# Patient Record
Sex: Female | Born: 1940
Health system: Southern US, Community
[De-identification: ages and names within clinical notes are randomized; demographics above are authoritative.]

## PROBLEM LIST (undated history)

## (undated) DIAGNOSIS — E079 Disorder of thyroid, unspecified: Secondary | ICD-10-CM

## (undated) DIAGNOSIS — R000 Tachycardia, unspecified: Secondary | ICD-10-CM

## (undated) DIAGNOSIS — K219 Gastro-esophageal reflux disease without esophagitis: Secondary | ICD-10-CM

## (undated) DIAGNOSIS — I10 Essential (primary) hypertension: Secondary | ICD-10-CM

## (undated) DIAGNOSIS — E7800 Pure hypercholesterolemia, unspecified: Secondary | ICD-10-CM

## (undated) HISTORY — DX: Disorder of thyroid, unspecified: E07.9

---

## 2009-05-25 ENCOUNTER — Emergency Department (HOSPITAL_COMMUNITY): Admission: EM | Admit: 2009-05-25 | Discharge: 2009-05-25 | Payer: Self-pay | Admitting: Family Medicine

## 2010-11-04 ENCOUNTER — Emergency Department (HOSPITAL_BASED_OUTPATIENT_CLINIC_OR_DEPARTMENT_OTHER)
Admission: EM | Admit: 2010-11-04 | Discharge: 2010-11-05 | Disposition: A | Discharge status: Nursing Home (Includes ICF) | Payer: Self-pay | Source: Home / Self Care | Attending: Emergency Medicine | Admitting: Emergency Medicine

## 2010-11-04 DIAGNOSIS — R000 Tachycardia, unspecified: Secondary | ICD-10-CM | POA: Insufficient documentation

## 2010-11-04 DIAGNOSIS — I10 Essential (primary) hypertension: Secondary | ICD-10-CM | POA: Insufficient documentation

## 2010-11-04 DIAGNOSIS — R0602 Shortness of breath: Secondary | ICD-10-CM | POA: Insufficient documentation

## 2010-11-05 ENCOUNTER — Inpatient Hospital Stay (HOSPITAL_COMMUNITY)
Admission: AD | Admit: 2010-11-05 | Discharge: 2010-11-07 | DRG: 645 | Disposition: A | Discharge status: Home / Self Care | Payer: Self-pay | Source: Other Acute Inpatient Hospital | Attending: Internal Medicine | Admitting: Internal Medicine

## 2010-11-05 ENCOUNTER — Inpatient Hospital Stay (HOSPITAL_COMMUNITY): Payer: Self-pay

## 2010-11-05 ENCOUNTER — Emergency Department (INDEPENDENT_AMBULATORY_CARE_PROVIDER_SITE_OTHER): Payer: Self-pay

## 2010-11-05 DIAGNOSIS — I498 Other specified cardiac arrhythmias: Secondary | ICD-10-CM | POA: Diagnosis present

## 2010-11-05 DIAGNOSIS — R1310 Dysphagia, unspecified: Secondary | ICD-10-CM | POA: Diagnosis present

## 2010-11-05 DIAGNOSIS — M545 Low back pain, unspecified: Secondary | ICD-10-CM | POA: Diagnosis present

## 2010-11-05 DIAGNOSIS — R0602 Shortness of breath: Secondary | ICD-10-CM

## 2010-11-05 DIAGNOSIS — R000 Tachycardia, unspecified: Secondary | ICD-10-CM

## 2010-11-05 DIAGNOSIS — I10 Essential (primary) hypertension: Secondary | ICD-10-CM | POA: Diagnosis present

## 2010-11-05 DIAGNOSIS — E0500 Thyrotoxicosis with diffuse goiter without thyrotoxic crisis or storm: Principal | ICD-10-CM | POA: Diagnosis present

## 2010-11-05 DIAGNOSIS — J45909 Unspecified asthma, uncomplicated: Secondary | ICD-10-CM | POA: Diagnosis present

## 2010-11-05 DIAGNOSIS — Z7982 Long term (current) use of aspirin: Secondary | ICD-10-CM

## 2010-11-05 LAB — GLUCOSE, CAPILLARY
Glucose-Capillary: 97 mg/dL (ref 70–99)
Glucose-Capillary: 95 mg/dL (ref 70–99)
Glucose-Capillary: 86 mg/dL (ref 70–99)

## 2010-11-05 LAB — URINE MICROSCOPIC-ADD ON

## 2010-11-05 LAB — DIFFERENTIAL
Basophils Absolute: 0 K/uL (ref 0.0–0.1)
Basophils Relative: 0 % (ref 0–1)
Eosinophils Absolute: 0.3 K/uL (ref 0.0–0.7)
Eosinophils Relative: 4 % (ref 0–5)
Lymphocytes Relative: 37 % (ref 12–46)
Lymphs Abs: 2.6 K/uL (ref 0.7–4.0)
Monocytes Absolute: 0.9 K/uL (ref 0.1–1.0)
Monocytes Relative: 12 % (ref 3–12)
Neutro Abs: 3.4 K/uL (ref 1.7–7.7)
Neutrophils Relative %: 47 % (ref 43–77)

## 2010-11-05 LAB — POCT CARDIAC MARKERS
CKMB, poc: <1 ng/mL — ABNORMAL LOW (ref 1.0–8.0)
Myoglobin, poc: 51.2 ng/mL (ref 12–200)
Troponin i, poc: <0.05 ng/mL (ref 0.00–0.09)

## 2010-11-05 LAB — COMPREHENSIVE METABOLIC PANEL WITH GFR
ALT: 17 U/L (ref 0–35)
AST: 24 U/L (ref 0–37)
Albumin: 4.2 g/dL (ref 3.5–5.2)
Alkaline Phosphatase: 149 U/L — ABNORMAL HIGH (ref 39–117)
BUN: 14 mg/dL (ref 6–23)
CO2: 25 meq/L (ref 19–32)
Calcium: 9.9 mg/dL (ref 8.4–10.5)
Chloride: 107 meq/L (ref 96–112)
Creatinine, Ser: 0.5 mg/dL (ref 0.4–1.2)
GFR calc non Af Amer: >60 mL/min
Glucose, Bld: 150 mg/dL — ABNORMAL HIGH (ref 70–99)
Potassium: 3.9 meq/L (ref 3.5–5.1)
Sodium: 145 meq/L (ref 135–145)
Total Bilirubin: 1.4 mg/dL — ABNORMAL HIGH (ref 0.3–1.2)
Total Protein: 7.5 g/dL (ref 6.0–8.3)

## 2010-11-05 LAB — URINALYSIS, ROUTINE W REFLEX MICROSCOPIC
Ketones, ur: NEGATIVE mg/dL
Leukocytes, UA: NEGATIVE
Nitrite: NEGATIVE
Protein, ur: NEGATIVE mg/dL

## 2010-11-05 LAB — TROPONIN I: Troponin I: 0.02 ng/mL (ref 0.00–0.06)

## 2010-11-05 LAB — CBC
MCH: 26.4 pg (ref 26.0–34.0)
MCV: 78.9 fL (ref 78.0–100.0)
Platelets: 315 10*3/uL (ref 150–400)
RDW: 12.4 % (ref 11.5–15.5)
WBC: 7.1 10*3/uL (ref 4.0–10.5)

## 2010-11-05 LAB — CK TOTAL AND CKMB (NOT AT ARMC)
CK, MB: 1.2 ng/mL (ref 0.3–4.0)
CK, MB: 1 ng/mL (ref 0.3–4.0)
Relative Index: INVALID (ref 0.0–2.5)
Relative Index: INVALID (ref 0.0–2.5)
Total CK: 52 U/L (ref 7–177)
Total CK: 47 U/L (ref 7–177)
Total CK: 41 U/L (ref 7–177)

## 2010-11-05 LAB — TSH: TSH: 0.01 u[IU]/mL — ABNORMAL LOW (ref 0.350–4.500)

## 2010-11-05 LAB — T3, FREE: T3, Free: 10 pg/mL — ABNORMAL HIGH (ref 2.3–4.2)

## 2010-11-05 LAB — POCT B-TYPE NATRIURETIC PEPTIDE (BNP): B Natriuretic Peptide, POC: 20 pg/mL (ref 0–100)

## 2010-11-05 MED ORDER — IOHEXOL 350 MG/ML SOLN
80.0000 mL | Freq: Once | INTRAVENOUS | Status: AC | PRN
  Administered 2010-11-05: 80 mL via INTRAVENOUS

## 2010-11-06 ENCOUNTER — Inpatient Hospital Stay (HOSPITAL_COMMUNITY): Payer: Self-pay

## 2010-11-06 LAB — GLUCOSE, CAPILLARY
Glucose-Capillary: 101 mg/dL — ABNORMAL HIGH (ref 70–99)
Glucose-Capillary: 115 mg/dL — ABNORMAL HIGH (ref 70–99)
Glucose-Capillary: 125 mg/dL — ABNORMAL HIGH (ref 70–99)
Glucose-Capillary: 144 mg/dL — ABNORMAL HIGH (ref 70–99)
Glucose-Capillary: 189 mg/dL — ABNORMAL HIGH (ref 70–99)

## 2010-11-06 LAB — CBC
Hemoglobin: 11.2 g/dL — ABNORMAL LOW (ref 12.0–15.0)
RBC: 4.41 MIL/uL (ref 3.87–5.11)

## 2010-11-06 LAB — BASIC METABOLIC PANEL
CO2: 26 mEq/L (ref 19–32)
Chloride: 106 mEq/L (ref 96–112)
GFR calc Af Amer: >60 mL/min (ref >60)
Sodium: 139 mEq/L (ref 135–145)

## 2010-11-06 NOTE — Consult Note (Signed)
NAMECARIDAD, Katelyn Bennett                 ACCOUNT NO.:  [id number]  MEDICAL RECORD NO.:  [id number]           PATIENT TYPE:  I  LOCATION:  3706                         FACILITY:  [facility]  PHYSICIAN:  Tonita Cong, M.D.  DATE OF BIRTH:  1941-03-23  DATE OF CONSULTATION:  11/05/2010 DATE OF DISCHARGE:                                CONSULTATION   REQUESTING PHYSICIAN:  Ramiro Harvest, MD of [facility].  REASON FOR CONSULTATION:  Hyperthyroidism.  CHIEF COMPLAINT:  Palpitations.  HISTORY OF PRESENT ILLNESS:  [name] has experienced palpitations that were intermittent for the past 2 weeks.  Then, on the day of admission, it was persistent with associated dyspnea.  She has also had recent tremors that are intermittent in her hands.  She describes muscle cramps in her forearms.  She has dysphagia with water, pills, and food that has been intermittent and relatively mild "for while" about 80-month duration "nothing new" according to her family.  She is accompanied by her daughter who translates into [place] on her behalf. No precipitating or contributing factors to their knowledge.  She has not taken any thyroid hormone by prescriptions or supplement.  To their knowledge, no previous thyroid dysfunction for the patient.  FAMILY MEDICAL HISTORY:  One daughter who has hyperthyroidism diagnosed at age 33, now managed with a medication.  Initially in remission but now requiring a medication again.  Another daughter had a goiter in her early teens.  It resolved with increased dietary iodine intake plus a  medication.  The patient has no known drug allergies.  MEDICATION LIST:  Reviewed.  PAST MEDICAL HISTORY:  Asthma, hypertension, chronic low back pain, and recent vitamin D deficiency.  SOCIAL HISTORY:  The patient lives in [place] but is currently visiting one of her daughters.  No tobacco use.  She plans to return to [place] this Wednesday.  REVIEW OF SYSTEMS:  No weight  loss or weight gain.  No nausea, vomiting, or abdominal pain.  No diarrhea, bulging eyes, or eye redness but she does have itching eyes.  No hoarseness, heat intolerance, or rash.  PHYSICAL EXAMINATION:  VITAL SIGNS:  Temperature 97.5, pulse 117 but regular, respirations 19, blood pressure 130/78, and oxygen saturation 96% on room air. GENERAL:  Lying quietly and comfortably in bed. HEAD, EARS, NOSE, AND THROAT:  Unremarkable.  Eyes: no proptosis, stare, lid lag, or globe lag.  Extraocular muscles seem intact. NECK:  Mild symmetric enlargement of thyroid without audible bruit or discrete nodule.  The thyroid gland is a bit firm without a discrete area of induration.  Thyroid gland is mobile and nontender.  No cervical lymphadenopathy. CARDIOVASCULAR:  Regular but rapid.  Pulses full. SKIN:  Mildly excessive warmth.  No visible rash or jaundice. RESPIRATORY:  Clear, symmetric, unlabored. NEUROLOGIC:  No tremor.  No focal deficits.  LABORATORY DATA:  TSH 0.01 microunits per milliliter which is low.  Free T4 is 3.54 ng/dL with reference range 6.0-4.5.  free T3 of 10 picogram per mL with reference range 2.3-4.2.  Sodium 145, potassium 3.9, BUN 14, creatinine 0.5, and calcium 9.9.  Albumin 4.2,  ALT 17, AST 24, alkaline phosphatase mildly elevated at 149, and bilirubin 1.4 which is mildly elevated.  Neutrophils 3.4, platelets 315, MCV 78.9, hemoglobin 12.4, and white blood count 7.1.  She had a CT scan of chest with contrast including 80 mL Omnipaque 350 .  The thyroid appeared upper limit of normal in size.  No pulmonary embolism.  She received Pneumovax and Fluvirin immunizations today.  Now receiving Metoprolol 25 mg by mouth three times a day.  ASSESSMENT AND PLAN:  Hyperthyroidism.    Presumably Graves disease based upon her family history including a daughter with hyperthyroidism managed with medication at a relatively young age.  No discrete nodules by exam but a thyroid  ultrasound has been planned.  Other possibilities include a thyroiditis.  If a thyroiditis, then thyrotoxicosis should resolve within 3 months even without intervention.  Toxic nodules or Graves' disease would benefit from methimazole or another antithyroid medication.  At this time, her levels are too high and her symptoms too great to proceed directly with thyroid surgery.  Since she had iodinated  contrast that will postpone nuclear medicine I-131 therapy or even a diagnostic thyroid uptake scan for at least 6 weeks.  With the iodine exposure, this could transiently worsen her hyperthyroidism.  I explained the risks  to the daughter and the patient.    We will start methimazole 60 mg by mouth once a day now.  We discussed the anticipated benefits, alternatives, nature of therapy, and risks including but not limited to rash, allergic reactions, liver damage, bone marrow suppression, etc.  I instructed the patient and her daughter to discontinue methimazole and contact me or her primary care physician if she develops rash, jaundice, fever, sore throat, or other concerning symptoms while taking methimazole.  At this time, we will check thyroid antibody studies including thyroid peroxidase antibody, thyroglobulin antibody, and thyroid stimulating immunoglobulin.  Continue beta-blocker therapy and proceed with thyroid ultrasound as planned by the hospitalist team, specifically Dr. Janee Morn.  Generally, I would obtain a CBC with differential and hepatic panel about 1 week after starting the methimazole and then thyroid function tests including TSH and free T4 about 3 weeks after starting methimazole.  However, [name] plans to leave for [place] this Wednesday.  If she is still in the hospital, I  recommend a CBC with differential and hepatic panel before her hospital discharge.  The daughter agreed to contact her physician in [place] to arrange a visit as soon as possible after her return  to her home [place].     Tonita Cong, M.D.     JSK/MEDQ  D:  11/05/2010  T:  11/06/2010  Job:  [id number]  Electronically Signed by Talmage Coin M.D. on 11/06/2010 04:38:21 PM

## 2010-11-07 LAB — COMPREHENSIVE METABOLIC PANEL
Albumin: 3.4 g/dL — ABNORMAL LOW (ref 3.5–5.2)
BUN: 14 mg/dL (ref 6–23)
Chloride: 104 mEq/L (ref 96–112)
Creatinine, Ser: 0.43 mg/dL (ref 0.4–1.2)
Total Bilirubin: 1.2 mg/dL (ref 0.3–1.2)

## 2010-11-07 LAB — DIFFERENTIAL
Basophils Relative: 0 % (ref 0–1)
Eosinophils Absolute: 0.3 10*3/uL (ref 0.0–0.7)
Monocytes Absolute: 0.8 10*3/uL (ref 0.1–1.0)
Monocytes Relative: 12 % (ref 3–12)

## 2010-11-07 LAB — CBC
Hemoglobin: 12.6 g/dL (ref 12.0–15.0)
MCH: 25.8 pg — ABNORMAL LOW (ref 26.0–34.0)
MCHC: 32.4 g/dL (ref 30.0–36.0)
Platelets: 308 10*3/uL (ref 150–400)

## 2010-11-07 LAB — GLUCOSE, CAPILLARY
Glucose-Capillary: 105 mg/dL — ABNORMAL HIGH (ref 70–99)
Glucose-Capillary: 116 mg/dL — ABNORMAL HIGH (ref 70–99)

## 2010-11-07 LAB — THYROID ANTIBODIES
Thyroglobulin Ab: 46 U/mL — ABNORMAL HIGH (ref <40.0)
Thyroperoxidase Ab SerPl-aCnc: 1327 IU/mL — ABNORMAL HIGH (ref <35.0)

## 2010-11-11 NOTE — Discharge Summary (Signed)
Katelyn Bennett, Katelyn Bennett                 ACCOUNT NO.:  [id number]  MEDICAL RECORD NO.:  [id number]           PATIENT TYPE:  I  LOCATION:  3706                         FACILITY:  [facility]  PHYSICIAN:  Ramiro Harvest, MD    DATE OF BIRTH:  [birth year]  DATE OF ADMISSION:  11/05/2010 DATE OF DISCHARGE:  11/07/2010                        DISCHARGE SUMMARY   PRIMARY CARE PHYSICIAN:  The patient's primary care physician is in [place].  ENDOCRINOLOGIST:  Dr. Tonita Cong, MD  DISCHARGE DIAGNOSES: 1. Hyperthyroidism presumably Graves disease versus thyroiditis versus     autoimmune thyroid disease. 2. Palpitations secondary to hyperthyroidism. 3. Sinus tachycardia secondary to hyperthyroidism. 4. Hypertension. 5. History of asthma. 6. Chronic low back pain. 7. Recent vitamin D deficiency. 8. Dysphasia likely secondary to hyperthyroidism.  DISCHARGE MEDICATIONS: 1. Methimazole 60 mg p.o. daily. 2. Metoprolol 75 mg p.o. b.i.d. 3. Advair Diskus 100/50 1 puff twice daily. 4. Aleve 220 mg p.o. b.i.d. p.r.n. 5. Aspirin 81 mg p.o. daily. 6. Gabapentin 100 mg p.o. t.i.d.  DISPOSITION AND FOLLOWUP:  The patient will be discharged home.  The patient is to call Dr. Daune Perch office tomorrow to follow up on her hyperthyroidism and it would be decided for hospital follow up at that time as do an appointment if needed.  The patient is also to follow up with her PCP when she goes back home to [place] at which point in time with hyperthyroidism and will need to be followed up upon and her rate control as well as 2D echo results, which were done during this hospitalization will need to be followed up upon as these were pending at the time of this dictation.  CONSULTATIONS DONE:  Endocrinology consultation was done.  The patient was seen in consultation by Dr. Talmage Coin on November 05, 2010.  PROCEDURES PERFORMED:  Chest x-ray was done November 05, 2010 that showed questionable slight redistribution  of pulmonary blood flow.  No definite active process.  CT angiogram of the chest done November 05, 2010 showed no evidence of acute pulmonary embolism.  No acute abnormality, bilobular low attenuation instruction in the left upper quadrant with calcification most consistent with an old process, possibly involving the superior aspect of the left adrenal gland.  CT of the head without contrast done on November 05, 2010 negative CT of the head, esophagram which was done November 06, 2010 showed mild to moderate press the esophagus.  No evidence of esophageal stricture or other significant abnormality.  Thyroid ultrasound done on November 06, 2010 showed mild goiter with diffuse heterogeneous echogenicity and single less than 1 cm discrete left lobe nodule.  A 2D echocardiogram was done on November 07, 2010 and results are pending at the time of this dictation.  BRIEF ADMISSION HISTORY AND PHYSICAL:  Katelyn Bennett is a 70 year old [place] female who was visiting the family from [place] with a history of asthma, chronic low back pain, hypertension and been experiencing palpitations over the past week.  The patient was here from [place] visiting her daughter.  The patient comes and stays for a few months and usually goes back.  The patient  had been experiencing some cough and that she had Flovent, which she usually takes.  She had gone to a clinic in March, she was given Advair Diskus.  When she was there at that time, she was told that the heart rate was slightly high.  At that time she did not have any palpitations, but subsequent to that she started having palpitations off and on.  Yesterday, her palpitations became more persistent.  She decided to present to the ED.  In the ED, the patient was found to have heart rates in the 130s.  No chest pain or shortness of breath.  Denied any nausea, no vomiting, no abdominal pain, no dysuria, no discharge, no diarrhea, no dizziness or loss of consciousness.  No  focal deficits.  The patient's daughter stated that the patient does have some choking sensation when she takes the Neurontin pill.  In the ED, the patient had CT angiogram of the chest done, which was negative.  The patient was subsequently admitted for further observation of palpitation with sinus tachycardia.  For the rest of admission history and physical, please see H and P dictated per Dr. Toniann Fail of job number [id number].  HOSPITAL COURSE: 1. Hyperthyroidism.  The patient was admitted with some palpitations     which was a sinus tachycardia on EKG.  She was placed on a     telemonitoring and a heart rate on admission was 120s to the 130s.     A cardiac enzymes q.8 h x3 were cycled which were negative x3.  A     TSH, which was obtained was decrease at 0.010, free T4 which was     obtained came back elevated at 3.51 and a free T3 which was     obtained was elevated at 10.0.  A thyroid ultrasound was     subsequently done on November 06, 2010, which showed a mild goiter     with diffuse heterogeneous echogenicity and single less than 1 cm     discrete left lobe nodule.  The patient was placed on oral     Lopressor for rate control.  She took 25 mg twice daily, which was     subsequently increased to 25 mg three times daily.  She was     maintained on home dose for lisinopril as well for her blood     pressure issues/hypertension.  A Endocrinology consultation was     done.  The patient was seen in consultation by Dr. Talmage Coin on     November 05, 2010 at which time it was felt that the patient     presumably may have Graves disease based on her family history with     a daughter that had hyperthyroidism versus thyroiditis versus     autoimmune thyroid disease.  A thyroid antibody studies were     ordered and the patient had received iodinated contrast that she     had a CT angiogram of the chest done which was negative for PE.     Due to iodinated contrast, a nuclear medicine I-131  study/thyroid     uptake scan could not be done for the next 6 weeks and due to the     patient's iodine exposure, it was felt that this could transiently     worsen her hyperthyroidism.  The patient was started on methimazole     60 mg daily orally and she was monitored.  A hepatic panel was  obtained on day of discharge which was within normal limits.  The     patient remained stable.  She remained afebrile, did not have any     jaundice.  No fever, no sore throat, no other concerning symptoms     while she has been on the methimazole.  She was maintained on a     beta-blocker.  Beta-blocker therapy was increased on the day of     discharge to 75 mg twice daily for better rate control.  The     patient had planned to leave for [place] this Wednesday October 11, 2010.  The patient improved clinically during the     hospitalization.  She will be discharged home on Lopressor 75 mg     twice daily.  The patient is to call Dr. Daune Perch office tomorrow to     speak with him to decide on follow up for her hyperthyroidism.  Lab     studies are pending at the time of this dictation as well as her 2-     D echo.  The patient will be discharged in stable and improved     condition. 2. Palpitations secondary to hyperthyroidism. 3. Sinus tachycardia secondary to hyperthyroidism.  The patient was     admitted with palpitation.  She was noted to be in sinus     tachycardia.  She was placed on tele.  Workup was undertaken.     Cardiac enzymes were negative x3.  Thyroid studies which were done     were consistent with primary hyperthyroidism.  Please see problem     number 1.  A 2D echo was also obtained with results were pending at     the time of discharge, but preliminary results did show a normal to     slightly depressed EF with no significant wall motion     abnormalities.  We will need to follow up on final results of the 2-     D echo.  The patient's sinus tachycardia improved slowly on  oral     Lopressor and Lopressor dose has been increased to 75 mg twice     daily on day of discharge and she will follow up with her PCP as     outpatient once she gets back to [place].  The rest of the     patient's chronic medical issues have remained stable during this     hospitalization.  The patient will be discharged in stable and     improved condition.  The patient will follow up with her PCP in     [place] as the patient is leaving for [place] on Wednesday November 09, 2010.  VITAL SIGNS:  On the day of discharge, vital signs temperature 98.1, pulse of ranged from 80s to 126, blood pressure 117/70, respirations 18, satting 94% on room air.  DISCHARGE LABS:  Sodium 134, potassium 3.8, chloride 104, bicarb 23, BUN 14, creatinine 0.43, glucose 101, bilirubin 1.2, alk phosphatase 106, AST 20, ALT 14, calcium 9.4, albumin 3.4, protein of 6.4.  CBC with a white count of 6.6, hemoglobin 12.6, hematocrit 38.9 and platelet count of 308.  It has been a pleasure taking care of Katelyn Bennett.     Ramiro Harvest, MD     DT/MEDQ  D:  11/07/2010  T:  11/07/2010  Job:  [id number]  cc:   Tonita Cong, M.D.  Electronically Signed by Ramiro Harvest  MD on 11/11/2010 12:17:38 PM

## 2010-11-21 NOTE — H&P (Signed)
NAMESEANNE, Katelyn Bennett                 ACCOUNT NO.:  [id number]  MEDICAL RECORD NO.:  [id number]           PATIENT TYPE:  I  LOCATION:  3706                         FACILITY:  [facility]  PHYSICIAN:  Eduard Clos, MDDATE OF BIRTH:  17-Jan-1941  DATE OF ADMISSION:  11/05/2010 DATE OF DISCHARGE:                             HISTORY & PHYSICAL   PRIMARY CARE PHYSICIAN:  Unassigned.  CHIEF COMPLAINT:  Palpitation.  HISTORY OF PRESENT ILLNESS:  A 70 year old female with history of asthma, chronic low back pain, hypertension, has been experiencing palpitation over the last 1 week.  The patient is originally from [place] visiting her daughter.  She comes and stays for few months usually and goes back.  She has been experiencing some cough, as she had Flovent, which she usually takes, and she had gone to a clinic in [place] [employer] where she was given Advair Diskus, and when she was there at that time, she was told that the heart rate is slightly high.  At that time, she did not have any palpitation, but subsequent to that she started having palpitation off and on.  Yesterday, it became more persistent. She decided to come to the ER.  In the ER, the patient was found to have heart rates in the 130s with no chest pain or shortness of breath. Denies any nausea, vomiting or abdominal pain, dysuria, discharge, diarrhea, any dizziness or loss of consciousness or focal deficit.  Her daughter states that the patient does have some choking sensation when she takes the Neurontin pill.  In the ER, the patient had a CT angio of chest, which was negative.  The patient has been admitted for further observation of palpitation with sinus tachycardia.  PAST MEDICAL HISTORY:  History of asthma, hypertension, and low back pain.  PAST SURGICAL HISTORY:  None.  MEDICATIONS PRIOR TO ADMISSION:  Neurontin, lisinopril 10 mg daily, and Advair Diskus.  SOCIAL HISTORY:  The patient lives with her daughter, is  from [place], is on a visit here, which she usually does every year.  She stays for few months.  Denies smoking cigarettes, drinking alcohol, or using illegal drugs.  FAMILY HISTORY:  Nothing contributory.  REVIEW OF SYSTEMS:  As per history of present illness, nothing else significant.  ALLERGIES:  No known drug allergies.  PHYSICAL EXAMINATION:  The patient examined at bedside, not in acute distress.  PHYSICAL EXAMINATION:  VITAL SIGNS:  Blood pressure is 142/60, pulse of 120-130 per minute, sinus tachy, temperature 98.1, respirations 20, O2 sat 99%. HEENT:  Anicteric.  No pallor.  No discharge from ears, eyes, nose, or mouth.  No obvious swelling in the throat.  There is no facial asymmetry.  Tongue is midline. CHEST:  Bilateral air entry present.  No rhonchi.  No crepitation. HEART:  S1, S2 heard. ABDOMEN:  Soft, nontender.  Bowel sounds heard. CENTRAL NERVOUS SYSTEM:  Alert, awake, and oriented to time, place, and person.  Moves upper and lower extremities 5/5. EXTREMITIES:  Peripheral pulses felt.  No edema. LABORATORY DATA:  EKG shows sinus tachycardia, the beats of 124 beats per minute with nonspecific ST-T  changes.  Chest x-ray shows questionable slight redistribution of pulmonary blood flow.  No definite active process.  CT angio of the chest shows no evidence of acute pulmonary embolism.  No acute abnormality, bilobular low attenuation obstructing the left upper quadrant with calcification most consistent with whole process possibly involving the superior aspect of the left adrenal gland.  CBC; WBC 7.1, hemoglobin is 12.4, hematocrit is 37, platelets 315.  Complete metabolic panel; sodium 145, potassium 3.9, chloride 100, carbon dioxide 25, glucose 150, BUN 14, creatinine is 0.5, total bilirubin is 1.4, alk phos 149, AST 24, ALT 17, calcium 9.9, BNP is 20, CK-MB less than 1, troponin less than 0.05.  UA is negative for nitrites and leukocytes.  ASSESSMENT: 1.  Palpitation, sinus tachycardia. 2. History of bronchial asthma. 3. History of hypertension. 4. Dysphagia. 5. Chronic low-back pain.  At this time, admit the patient to     telemetry for observation. 6. For palpitation.  At this time, monitor showing sinus tachycardia,     beats around 120-130 beats per minute.  We will check a stat TSH,     free T4 and T3.  We will get a 2D echo. 7. For hypertension, the patient will be continued on lisinopril     p.r.n., labetalol. 8. For asthma.  At this time, we will hold of her Advair Diskus.  If     she gets short of breath, she can get p.r.n. Xopenex.  The patient     presently is not short of breath. 9. If tachycardia continues, we may need to have a cardiology consult. 10.Further recommendation based on the test order and clinical course.     Eduard Clos, MD     ANK/MEDQ  D:  11/05/2010  T:  11/05/2010  Job:  [id number]  Electronically Signed by Midge Minium MD on 11/21/2010 07:56:08 AM

## 2011-12-22 ENCOUNTER — Other Ambulatory Visit: Payer: Self-pay | Admitting: Internal Medicine

## 2011-12-22 DIAGNOSIS — E049 Nontoxic goiter, unspecified: Secondary | ICD-10-CM

## 2011-12-26 ENCOUNTER — Ambulatory Visit
Admission: RE | Admit: 2011-12-26 | Discharge: 2011-12-26 | Disposition: A | Discharge status: Home / Self Care | Payer: No Typology Code available for payment source | Source: Ambulatory Visit | Attending: Internal Medicine | Admitting: Internal Medicine

## 2011-12-26 DIAGNOSIS — E049 Nontoxic goiter, unspecified: Secondary | ICD-10-CM

## 2011-12-27 ENCOUNTER — Emergency Department (HOSPITAL_COMMUNITY)
Admission: EM | Admit: 2011-12-27 | Discharge: 2011-12-27 | Disposition: A | Discharge status: Home / Self Care | Payer: Self-pay | Attending: Emergency Medicine | Admitting: Emergency Medicine

## 2011-12-27 ENCOUNTER — Encounter (HOSPITAL_COMMUNITY): Payer: Self-pay | Admitting: *Deleted

## 2011-12-27 DIAGNOSIS — R000 Tachycardia, unspecified: Secondary | ICD-10-CM | POA: Insufficient documentation

## 2011-12-27 DIAGNOSIS — E0590 Thyrotoxicosis, unspecified without thyrotoxic crisis or storm: Secondary | ICD-10-CM | POA: Insufficient documentation

## 2011-12-27 DIAGNOSIS — R634 Abnormal weight loss: Secondary | ICD-10-CM | POA: Insufficient documentation

## 2011-12-27 HISTORY — DX: Tachycardia, unspecified: R00.0

## 2011-12-27 LAB — BASIC METABOLIC PANEL
BUN: 15 mg/dL (ref 6–23)
CO2: 27 mEq/L (ref 19–32)
Calcium: 9.5 mg/dL (ref 8.4–10.5)
Chloride: 103 mEq/L (ref 96–112)
Creatinine, Ser: 0.34 mg/dL — ABNORMAL LOW (ref 0.50–1.10)
GFR calc Af Amer: >90 mL/min (ref >90)
GFR calc non Af Amer: >90 mL/min (ref >90)
Glucose, Bld: 98 mg/dL (ref 70–99)
Potassium: 4.5 mEq/L (ref 3.5–5.1)
Sodium: 138 mEq/L (ref 135–145)

## 2011-12-27 LAB — CBC
HCT: 38.7 % (ref 36.0–46.0)
Hemoglobin: 12.6 g/dL (ref 12.0–15.0)
MCH: 25.6 pg — ABNORMAL LOW (ref 26.0–34.0)
MCHC: 32.6 g/dL (ref 30.0–36.0)
MCV: 78.7 fL (ref 78.0–100.0)
Platelets: 271 10*3/uL (ref 150–400)
RBC: 4.92 MIL/uL (ref 3.87–5.11)
RDW: 13 % (ref 11.5–15.5)
WBC: 7.8 10*3/uL (ref 4.0–10.5)

## 2011-12-27 LAB — TROPONIN I: Troponin I: <0.3 ng/mL (ref <0.30)

## 2011-12-27 LAB — T3, FREE: T3, Free: 14.6 pg/mL — ABNORMAL HIGH (ref 2.3–4.2)

## 2011-12-27 LAB — MAGNESIUM: Magnesium: 2.1 mg/dL (ref 1.5–2.5)

## 2011-12-27 LAB — T4, FREE: Free T4: 3.39 ng/dL — ABNORMAL HIGH (ref 0.80–1.80)

## 2011-12-27 LAB — TSH: TSH: <0.008 u[IU]/mL — ABNORMAL LOW (ref 0.350–4.500)

## 2011-12-27 MED ORDER — METHIMAZOLE 10 MG PO TABS
60.0000 mg | ORAL_TABLET | Freq: Once | ORAL | Status: AC
  Administered 2011-12-27: 60 mg via ORAL
  Filled 2011-12-27: qty 6

## 2011-12-27 MED ORDER — SODIUM CHLORIDE 0.9 % IV BOLUS (SEPSIS)
1000.0000 mL | Freq: Once | INTRAVENOUS | Status: AC
  Administered 2011-12-27: 1000 mL via INTRAVENOUS

## 2011-12-27 MED ORDER — METOPROLOL TARTRATE 50 MG PO TABS: 75.0000 mg | ORAL_TABLET | Freq: Two times a day (BID) | ORAL | Status: DC

## 2011-12-27 MED ORDER — METHIMAZOLE 20 MG PO TABS: 20.0000 mg | ORAL_TABLET | Freq: Three times a day (TID) | ORAL | Status: DC

## 2011-12-27 MED ORDER — METOPROLOL TARTRATE 1 MG/ML IV SOLN
5.0000 mg | Freq: Once | INTRAVENOUS | Status: AC
  Administered 2011-12-27: 5 mg via INTRAVENOUS
  Filled 2011-12-27: qty 5

## 2011-12-27 NOTE — ED Notes (Signed)
MD at bedside. 

## 2011-12-27 NOTE — Discharge Instructions (Signed)
Tachycardia, Nonspecific  In adults, the heart normally beats between 60 and 100 times a minute. A heart rate over 100 is called tachycardia. When your heart beats too fast, it may not be able to pump enough blood to the rest of the body.  CAUSES    Exercise or exertion.   Fever.   Pain or injury.   Infection.   Loss of fluid (dehydration).   Overactive thyroid.   Lack of red blood cells (anemia).   Anxiety.   Alcohol.   Heart arrhythmia.   Caffeine.   Tobacco products.   Diet pills.   Street drugs.   Heart disease.  SYMPTOMS   Palpitations (rapid or irregular heartbeat).   Dizziness.   Tiredness (fatigue).   Shortness of breath.  DIAGNOSIS   After an exam and taking a history, your caregiver may order:   Blood tests.   Electrocardiogram (EKG).   Heart monitor.  TREATMENT   Treatment will depend on the cause and potential for harm. It may include:   Intravenous (IV) replacement of fluids or blood.   Antidote or reversal medicines.   Changes in your present medicines.   Lifestyle changes.  HOME CARE INSTRUCTIONS    Get rest.   Drink enough water and fluids to keep your urine clear or pale yellow.   Avoid:   Caffeine.   Nicotine.   Alcohol.   Stress.   Chocolate.   Stimulants.   Only take medicine as directed by your caregiver.  SEEK IMMEDIATE MEDICAL CARE IF:    You have pain in your chest, upper arms, jaw, or neck.   You become weak, dizzy, or feel faint.   You have palpitations that will not go away.   You throw up (vomit), have diarrhea, or pass blood.   You look pale and your skin is cool and wet.  MAKE SURE YOU:    Understand these instructions.   Will watch your condition.   Will get help right away if you are not doing well or get worse.  Document Released: 09/14/2004 Document Revised: 07/27/2011 Document Reviewed: 07/18/2011  [employer] Patient Information 2012 [employer]

## 2011-12-27 NOTE — ED Provider Notes (Signed)
History    Katelyn Bennett with palpitations. Onset about 3w ago. Persistent. No other complaints at this time. Pt with admit 10/2010 with similar symptoms and found to be hyperthyroid. Did not follow-up after discharged. Traveled back to [place] and as not had continued care. No diarrhea. ~20 lbs unintentional weight loss in past year. No CP or SOB. No dizziness or lightheadedness.  CSN: [id number]  Arrival date & time 12/27/11  1120   None     Chief Complaint  Patient presents with  . Tachycardia    (Consider location/radiation/quality/duration/timing/severity/associated sxs/prior treatment) HPI  Past Medical History  Diagnosis Date  . Tachycardia     History reviewed. No pertinent past surgical history.  History reviewed. No pertinent family history.  History  Substance Use Topics  . Smoking status: Not on file  . Smokeless tobacco: Not on file  . Alcohol Use: No    OB History    Grav Para Term Preterm Abortions TAB SAB Ect Mult Living                  Review of Systems   Review of symptoms negative unless otherwise noted in HPI.   Allergies  Review of patient's allergies indicates no known allergies.  Home Medications   Current Outpatient Rx  Name Route Sig Dispense Refill  . ALBUTEROL SULFATE HFA 108 (90 BASE) MCG/ACT IN AERS Inhalation Inhale 2 puffs into the lungs every 6 (six) hours as needed. Shortness of breath    . ASPIRIN 81 MG PO TABS Oral Take 81 mg by mouth daily.    Marland Kitchen METOPROLOL TARTRATE 25 MG PO TABS Oral Take 12.5 mg by mouth 2 (two) times daily.      BP 121/54  Pulse 118  Temp(Src) 98.2 F (36.8 C) (Oral)  Resp 26  SpO2 98%  Physical Exam  Nursing note and vitals reviewed. Constitutional: She appears well-developed and well-nourished. No distress.       Laying in bed. nad.  HENT:  Mouth/Throat: Oropharynx is clear and moist.  Eyes: Conjunctivae are normal. Pupils are equal, round, and reactive to light. Right eye exhibits no discharge. Left  eye exhibits no discharge.  Neck: Neck supple.  Cardiovascular: Regular rhythm and normal heart sounds.  Exam reveals no gallop and no friction rub.   No murmur heard.      Tachycardic. No murmur.  Pulmonary/Chest: Effort normal and breath sounds normal. No respiratory distress.  Abdominal: Soft. She exhibits no distension. There is no tenderness.  Musculoskeletal: She exhibits no edema and no tenderness.  Neurological: She is alert. No cranial nerve deficit. She exhibits normal muscle tone. Coordination normal.  Skin: Skin is warm and dry. She is not diaphoretic.  Psychiatric: She has a normal mood and affect. Her behavior is normal. Thought content normal.    ED Course  Procedures (including critical care time)  Labs Reviewed - No data to display US Soft Tissue Head/neck  12/26/2011  *RADIOLOGY REPORT*  Clinical Data: Thyroid nodule, thyromegaly on physical exam  THYROID ULTRASOUND  Technique: Ultrasound examination of the thyroid gland and adjacent soft tissues was performed.  Comparison:  11/06/2010  Findings:  Right thyroid lobe:  19 x 22 x 52 mm, inhomogeneous Left thyroid lobe:  19 x 20 x 43 mm Isthmus:  3.1 mm in thickness  Focal nodules:  None  Lymphadenopathy:  None visualized.  IMPRESSION:  1.  Mildly enlarged heterogeneous thyroid without nodule or other focal lesion.  Original Report Authenticated By: Lysle Rubens  HASSELL III, M.D.     1. Tachycardia   2. Hyperthyroidism    MDM  Katelyn Bennett with tachycardia. Likely 2/2 hyperthyroidism. Discussed case with Dr Sharl Ma who pt to folllow-up with soon as outpt. Pt's metoprolol increased and started on methimazole. Pt is tachy, but doubt thyroid storm and feel that further evaluation can be done as outpt at this time. Return precautions discussed with pt and daughter.        Raeford Razor, MD 12/30/11 [phone]

## 2011-12-27 NOTE — ED Notes (Addendum)
Per daughter- states that the pt has been having rapid heart beat for two weeks with shortness of breath.  Pt taking metoprolol.  Denies chest pain. Reports being hospitalized last year for same complaint.  Pt doesn't speak english, native language urdu.   Current- HR: 118, R: 27

## 2011-12-27 NOTE — ED Notes (Signed)
Family reports pts heart rate increased for the last couple weeks, today worsened with tachypnea.

## 2011-12-27 NOTE — ED Notes (Signed)
Family at bedside. Daughter 

## 2011-12-27 NOTE — ED Notes (Signed)
Reports having palpitations and tachycardia for several days, denies any cp or sob. Reports hx of tachycardia and takes meds for it as prescribed. ekg done at triage, HR 130.

## 2011-12-27 NOTE — ED Notes (Signed)
Waiting on medication from pharmacy. Request sent

## 2012-01-09 ENCOUNTER — Other Ambulatory Visit: Payer: Self-pay | Admitting: Internal Medicine

## 2012-01-09 DIAGNOSIS — E0500 Thyrotoxicosis with diffuse goiter without thyrotoxic crisis or storm: Secondary | ICD-10-CM

## 2012-01-09 DIAGNOSIS — E0590 Thyrotoxicosis, unspecified without thyrotoxic crisis or storm: Secondary | ICD-10-CM

## 2012-01-23 ENCOUNTER — Encounter (HOSPITAL_COMMUNITY)
Admission: RE | Admit: 2012-01-23 | Discharge: 2012-01-23 | Disposition: A | Discharge status: Home / Self Care | Payer: Self-pay | Source: Ambulatory Visit | Attending: Internal Medicine | Admitting: Internal Medicine

## 2012-01-23 DIAGNOSIS — E0500 Thyrotoxicosis with diffuse goiter without thyrotoxic crisis or storm: Secondary | ICD-10-CM | POA: Insufficient documentation

## 2012-01-23 DIAGNOSIS — E0590 Thyrotoxicosis, unspecified without thyrotoxic crisis or storm: Secondary | ICD-10-CM

## 2012-01-23 DIAGNOSIS — R1310 Dysphagia, unspecified: Secondary | ICD-10-CM | POA: Insufficient documentation

## 2012-01-23 DIAGNOSIS — R070 Pain in throat: Secondary | ICD-10-CM | POA: Insufficient documentation

## 2012-01-24 ENCOUNTER — Encounter (HOSPITAL_COMMUNITY)
Admission: RE | Admit: 2012-01-24 | Discharge: 2012-01-24 | Disposition: A | Discharge status: Home / Self Care | Payer: Self-pay | Source: Ambulatory Visit | Attending: Internal Medicine | Admitting: Internal Medicine

## 2012-01-24 MED ORDER — SODIUM IODIDE I 131 CAPSULE: 10.9000 | Freq: Once | INTRAVENOUS | Status: AC | PRN

## 2012-01-24 MED ORDER — SODIUM PERTECHNETATE TC 99M INJECTION
10.0000 | Freq: Once | INTRAVENOUS | Status: AC | PRN
  Administered 2012-01-24: 10 via INTRAVENOUS

## 2012-02-01 ENCOUNTER — Ambulatory Visit (HOSPITAL_COMMUNITY)
Admission: RE | Admit: 2012-02-01 | Discharge: 2012-02-01 | Disposition: A | Discharge status: Home / Self Care | Payer: Self-pay | Source: Ambulatory Visit | Attending: Internal Medicine | Admitting: Internal Medicine

## 2012-02-01 DIAGNOSIS — E0500 Thyrotoxicosis with diffuse goiter without thyrotoxic crisis or storm: Secondary | ICD-10-CM | POA: Insufficient documentation

## 2012-02-01 DIAGNOSIS — E0590 Thyrotoxicosis, unspecified without thyrotoxic crisis or storm: Secondary | ICD-10-CM | POA: Insufficient documentation

## 2012-02-01 MED ORDER — SODIUM IODIDE I 131 CAPSULE
26.4000 | Freq: Once | INTRAVENOUS | Status: AC | PRN
  Administered 2012-02-01: 26.4 via ORAL

## 2014-05-17 ENCOUNTER — Encounter: Payer: Self-pay | Admitting: *Deleted

## 2014-06-05 ENCOUNTER — Encounter (HOSPITAL_COMMUNITY): Payer: Self-pay | Admitting: Emergency Medicine

## 2014-06-05 DIAGNOSIS — B029 Zoster without complications: Secondary | ICD-10-CM | POA: Diagnosis not present

## 2014-06-05 DIAGNOSIS — T404X5A Adverse effect of other synthetic narcotics, initial encounter: Secondary | ICD-10-CM | POA: Insufficient documentation

## 2014-06-05 DIAGNOSIS — Z79899 Other long term (current) drug therapy: Secondary | ICD-10-CM | POA: Diagnosis not present

## 2014-06-05 DIAGNOSIS — E079 Disorder of thyroid, unspecified: Secondary | ICD-10-CM | POA: Diagnosis not present

## 2014-06-05 DIAGNOSIS — Z7982 Long term (current) use of aspirin: Secondary | ICD-10-CM | POA: Diagnosis not present

## 2014-06-05 DIAGNOSIS — R112 Nausea with vomiting, unspecified: Secondary | ICD-10-CM | POA: Insufficient documentation

## 2014-06-05 DIAGNOSIS — Z792 Long term (current) use of antibiotics: Secondary | ICD-10-CM | POA: Insufficient documentation

## 2014-06-05 MED ORDER — ONDANSETRON 4 MG PO TBDP
8.0000 mg | ORAL_TABLET | Freq: Once | ORAL | Status: AC
  Administered 2014-06-05: 8 mg via ORAL
  Filled 2014-06-05: qty 2

## 2014-06-05 MED ORDER — OXYCODONE-ACETAMINOPHEN 5-325 MG PO TABS
1.0000 | ORAL_TABLET | Freq: Once | ORAL | Status: AC
  Administered 2014-06-05: 1 via ORAL
  Filled 2014-06-05: qty 1

## 2014-06-05 NOTE — ED Notes (Signed)
thge pt was diagnosed with shingles on Monday on the rt side of her face.  She ius c/o pain and she has had vomiting also

## 2014-06-05 NOTE — ED Notes (Signed)
She may have vonmited the percocet back

## 2014-06-05 NOTE — ED Notes (Signed)
She has vicodin for pain but she has been unable to hold any med  down

## 2014-06-06 ENCOUNTER — Emergency Department (HOSPITAL_COMMUNITY)
Admission: EM | Admit: 2014-06-06 | Discharge: 2014-06-06 | Disposition: A | Discharge status: Home / Self Care | Payer: Medicaid Other | Attending: Emergency Medicine | Admitting: Emergency Medicine

## 2014-06-06 DIAGNOSIS — B029 Zoster without complications: Secondary | ICD-10-CM

## 2014-06-06 DIAGNOSIS — R110 Nausea: Secondary | ICD-10-CM

## 2014-06-06 LAB — CBC WITH DIFFERENTIAL/PLATELET
Basophils Absolute: 0 10*3/uL (ref 0.0–0.1)
Basophils Relative: 0 % (ref 0–1)
EOS ABS: 0 10*3/uL (ref 0.0–0.7)
Eosinophils Relative: 0 % (ref 0–5)
HEMATOCRIT: 41.3 % (ref 36.0–46.0)
HEMOGLOBIN: 13.8 g/dL (ref 12.0–15.0)
LYMPHS ABS: 3.1 10*3/uL (ref 0.7–4.0)
Lymphocytes Relative: 38 % (ref 12–46)
MCH: 28.3 pg (ref 26.0–34.0)
MCHC: 33.4 g/dL (ref 30.0–36.0)
MCV: 84.8 fL (ref 78.0–100.0)
MONOS PCT: 5 % (ref 3–12)
Monocytes Absolute: 0.4 10*3/uL (ref 0.1–1.0)
NEUTROS PCT: 57 % (ref 43–77)
Neutro Abs: 4.5 10*3/uL (ref 1.7–7.7)
Platelets: 297 10*3/uL (ref 150–400)
RBC: 4.87 MIL/uL (ref 3.87–5.11)
RDW: 13.1 % (ref 11.5–15.5)
WBC: 8 10*3/uL (ref 4.0–10.5)

## 2014-06-06 LAB — BASIC METABOLIC PANEL
Anion gap: 13 (ref 5–15)
BUN: 10 mg/dL (ref 6–23)
CHLORIDE: 94 meq/L — AB (ref 96–112)
CO2: 24 mEq/L (ref 19–32)
Calcium: 9.6 mg/dL (ref 8.4–10.5)
Creatinine, Ser: 0.6 mg/dL (ref 0.50–1.10)
GFR calc Af Amer: >90 mL/min (ref >90)
GFR, EST NON AFRICAN AMERICAN: 88 mL/min — AB (ref >90)
GLUCOSE: 105 mg/dL — AB (ref 70–99)
POTASSIUM: 3.7 meq/L (ref 3.7–5.3)
Sodium: 131 mEq/L — ABNORMAL LOW (ref 137–147)

## 2014-06-06 MED ORDER — ONDANSETRON 4 MG PO TBDP
4.0000 mg | ORAL_TABLET | Freq: Once | ORAL | Status: AC
Start: 2014-06-06 — End: 2014-06-06
  Administered 2014-06-06: 4 mg via ORAL
  Filled 2014-06-06: qty 1

## 2014-06-06 MED ORDER — ONDANSETRON HCL 4 MG/2ML IJ SOLN
4.0000 mg | Freq: Once | INTRAMUSCULAR | Status: AC
  Administered 2014-06-06: 4 mg via INTRAVENOUS
  Filled 2014-06-06: qty 2

## 2014-06-06 MED ORDER — ONDANSETRON 8 MG PO TBDP: 8.0000 mg | ORAL_TABLET | Freq: Three times a day (TID) | ORAL | Status: DC | PRN

## 2014-06-06 MED ORDER — OXYCODONE-ACETAMINOPHEN 5-325 MG PO TABS
1.0000 | ORAL_TABLET | ORAL | Status: DC | PRN
Start: 2014-06-06 — End: 2014-07-18

## 2014-06-06 MED ORDER — OXYCODONE-ACETAMINOPHEN 5-325 MG PO TABS
1.0000 | ORAL_TABLET | Freq: Once | ORAL | Status: AC
  Administered 2014-06-06: 1 via ORAL
  Filled 2014-06-06: qty 1

## 2014-06-06 MED ORDER — FENTANYL CITRATE 0.05 MG/ML IJ SOLN
50.0000 ug | Freq: Once | INTRAMUSCULAR | Status: AC
  Administered 2014-06-06: 50 ug via INTRAVENOUS
  Filled 2014-06-06: qty 2

## 2014-06-06 NOTE — ED Provider Notes (Signed)
CSN: [id number]     Arrival date & time 06/05/14  2312 History   First MD Initiated Contact with Patient 06/06/14 0226     Chief Complaint  Patient presents with  . Emesis    HPI Patient was as shingles on the right side of her face this past Monday. Since that time she has been having persistent pain in the right side of her face. She was initially prescribed Ultram and hydrocodone however those have caused her nausea and she has not been eating or drinking well. Patient came to the emergency room because she continues to feel nauseated. She's not able to take her pain medications. She's not having trouble with fevers. She denies any visual changes. She denies abdominal pain or chest pain Past Medical History  Diagnosis Date  . Tachycardia   . Thyroid disease    History reviewed. No pertinent past surgical history. Family History  Problem Relation Age of Onset  . Thyroid disease Daughter    History  Substance Use Topics  . Smoking status: Never Smoker   . Smokeless tobacco: Not on file  . Alcohol Use: No   OB History   Grav Para Term Preterm Abortions TAB SAB Ect Mult Living                 Review of Systems  All other systems reviewed and are negative.     Allergies  Review of patient's allergies indicates no known allergies.  Home Medications   Prior to Admission medications   Medication Sig Start Date End Date Taking? Authorizing Provider  aspirin EC 81 MG tablet Take 81 mg by mouth daily.   Yes Historical Provider, MD  gabapentin (NEURONTIN) 100 MG capsule Take 100 mg by mouth at bedtime.   Yes Historical Provider, MD  HYDROcodone-acetaminophen (NORCO/VICODIN) 5-325 MG per tablet Take 1-2 tablets by mouth every 4 (four) hours as needed for moderate pain.   Yes Historical Provider, MD  levothyroxine (SYNTHROID, LEVOTHROID) 50 MCG tablet Take 50 mcg by mouth daily before breakfast.   Yes Historical Provider, MD  traMADol (ULTRAM) 50 MG tablet Take 50 mg by mouth  every 6 (six) hours as needed for moderate pain.   Yes Historical Provider, MD  valACYclovir (VALTREX) 1000 MG tablet Take 1,000 mg by mouth 3 (three) times daily.   Yes Historical Provider, MD  ondansetron (ZOFRAN-ODT) 8 MG disintegrating tablet Take 1 tablet (8 mg total) by mouth every 8 (eight) hours as needed for nausea or vomiting. 06/06/14   Linwood DibblesJon Lonnette Shrode, MD  oxyCODONE-acetaminophen (PERCOCET/ROXICET) 5-325 MG per tablet Take 1 tablet by mouth every 4 (four) hours as needed for severe pain. 06/06/14   Linwood DibblesJon Abigail Teall, MD   BP 130/64  Pulse 63  Temp(Src) 98 F (36.7 C)  Resp 12  Wt 123 lb 7 oz (55.991 kg)  SpO2 97% Physical Exam  Nursing note and vitals reviewed. Constitutional: She appears distressed.  HENT:  Head: Normocephalic and atraumatic.  Right Ear: External ear normal.  Left Ear: External ear normal.  Rash on the right side of the face that is consistent with healing ulcerations  Eyes: Conjunctivae are normal. Right eye exhibits no discharge. Left eye exhibits no discharge. No scleral icterus.  No evidence of orbital involvement  Neck: Neck supple. No tracheal deviation present.  Cardiovascular: Normal rate, regular rhythm and intact distal pulses.   Pulmonary/Chest: Effort normal and breath sounds normal. No stridor. No respiratory distress. She has no wheezes. She has no rales.  Abdominal: Soft. Bowel sounds are normal. She exhibits no distension. There is no tenderness. There is no rebound and no guarding.  Musculoskeletal: She exhibits no edema and no tenderness.  Neurological: She is alert. She has normal strength. No cranial nerve deficit (no facial droop, extraocular movements intact, no slurred speech) or sensory deficit. She exhibits normal muscle tone. She displays no seizure activity. Coordination normal.  Skin: Skin is warm and dry. Rash noted. She is not diaphoretic.  Psychiatric: She has a normal mood and affect.    ED Course  Procedures (including critical care  time) Labs Review Labs Reviewed  BASIC METABOLIC PANEL - Abnormal; Notable for the following:    Sodium 131 (*)    Chloride 94 (*)    Glucose, Bld 105 (*)    GFR calc non Af Amer 88 (*)    All other components within normal limits  CBC WITH DIFFERENTIAL    Imaging Review No results found.   EKG Interpretation None      MDM   Final diagnoses:  Shingles  Nausea    Patient was given dose of pain medications and antinausea medications. She improved with treatment. I explained to the patient's daughter that most narcotics can cause nausea nausea. We can try oxycodone instead.  Rx for Zofran  At this time there does not appear to be any evidence of an acute emergency medical condition and the patient appears stable for discharge with appropriate outpatient follow up.    [name], MD 06/06/14 [phone]

## 2014-06-06 NOTE — ED Notes (Signed)
Pt A&OX4 at d/c, wheeled out of ED via wheelchair, NAD 

## 2014-06-06 NOTE — Discharge Instructions (Signed)

## 2014-07-18 ENCOUNTER — Encounter (HOSPITAL_BASED_OUTPATIENT_CLINIC_OR_DEPARTMENT_OTHER): Payer: Self-pay | Admitting: *Deleted

## 2014-07-18 ENCOUNTER — Emergency Department (HOSPITAL_BASED_OUTPATIENT_CLINIC_OR_DEPARTMENT_OTHER): Payer: Medicaid Other

## 2014-07-18 ENCOUNTER — Observation Stay (HOSPITAL_BASED_OUTPATIENT_CLINIC_OR_DEPARTMENT_OTHER)
Admission: EM | Admit: 2014-07-18 | Discharge: 2014-07-19 | Disposition: A | Discharge status: Home / Self Care | Payer: Medicaid Other | Attending: Internal Medicine | Admitting: Internal Medicine

## 2014-07-18 DIAGNOSIS — R079 Chest pain, unspecified: Secondary | ICD-10-CM | POA: Diagnosis present

## 2014-07-18 DIAGNOSIS — Z8679 Personal history of other diseases of the circulatory system: Secondary | ICD-10-CM

## 2014-07-18 DIAGNOSIS — E038 Other specified hypothyroidism: Secondary | ICD-10-CM

## 2014-07-18 DIAGNOSIS — R0602 Shortness of breath: Secondary | ICD-10-CM

## 2014-07-18 DIAGNOSIS — E039 Hypothyroidism, unspecified: Secondary | ICD-10-CM | POA: Diagnosis present

## 2014-07-18 HISTORY — DX: Essential (primary) hypertension: I10

## 2014-07-18 LAB — CBC WITH DIFFERENTIAL/PLATELET
BASOS PCT: 0 % (ref 0–1)
Basophils Absolute: 0 10*3/uL (ref 0.0–0.1)
EOS PCT: 1 % (ref 0–5)
Eosinophils Absolute: 0.1 10*3/uL (ref 0.0–0.7)
HEMATOCRIT: 42.1 % (ref 36.0–46.0)
HEMOGLOBIN: 13.3 g/dL (ref 12.0–15.0)
Lymphocytes Relative: 20 % (ref 12–46)
Lymphs Abs: 1.7 10*3/uL (ref 0.7–4.0)
MCH: 28.2 pg (ref 26.0–34.0)
MCHC: 31.6 g/dL (ref 30.0–36.0)
MCV: 89.2 fL (ref 78.0–100.0)
MONO ABS: 0.4 10*3/uL (ref 0.1–1.0)
MONOS PCT: 5 % (ref 3–12)
NEUTROS ABS: 6.2 10*3/uL (ref 1.7–7.7)
Neutrophils Relative %: 74 % (ref 43–77)
Platelets: 294 10*3/uL (ref 150–400)
RBC: 4.72 MIL/uL (ref 3.87–5.11)
RDW: 14.2 % (ref 11.5–15.5)
WBC: 8.4 10*3/uL (ref 4.0–10.5)

## 2014-07-18 LAB — COMPREHENSIVE METABOLIC PANEL
ALBUMIN: 4.2 g/dL (ref 3.5–5.2)
ALK PHOS: 96 U/L (ref 39–117)
ALT: 11 U/L (ref 0–35)
AST: 21 U/L (ref 0–37)
Anion gap: 13 (ref 5–15)
BILIRUBIN TOTAL: 0.9 mg/dL (ref 0.3–1.2)
BUN: 9 mg/dL (ref 6–23)
CO2: 26 meq/L (ref 19–32)
Calcium: 9.7 mg/dL (ref 8.4–10.5)
Chloride: 104 mEq/L (ref 96–112)
Creatinine, Ser: 0.7 mg/dL (ref 0.50–1.10)
GFR calc Af Amer: >90 mL/min (ref >90)
GFR calc non Af Amer: 84 mL/min — ABNORMAL LOW (ref >90)
Glucose, Bld: 96 mg/dL (ref 70–99)
POTASSIUM: 4.2 meq/L (ref 3.7–5.3)
SODIUM: 143 meq/L (ref 137–147)
Total Protein: 8.2 g/dL (ref 6.0–8.3)

## 2014-07-18 LAB — TSH: TSH: 4.55 u[IU]/mL — ABNORMAL HIGH (ref 0.350–4.500)

## 2014-07-18 LAB — GLUCOSE, CAPILLARY: GLUCOSE-CAPILLARY: 86 mg/dL (ref 70–99)

## 2014-07-18 LAB — TROPONIN I

## 2014-07-18 LAB — PRO B NATRIURETIC PEPTIDE: Pro B Natriuretic peptide (BNP): <5 pg/mL (ref 0–125)

## 2014-07-18 LAB — D-DIMER, QUANTITATIVE: D-Dimer, Quant: <0.27 ug/mL-FEU (ref 0.00–0.48)

## 2014-07-18 MED ORDER — ACETAMINOPHEN 650 MG RE SUPP: 650.0000 mg | Freq: Four times a day (QID) | RECTAL | Status: DC | PRN

## 2014-07-18 MED ORDER — ALUM & MAG HYDROXIDE-SIMETH 200-200-20 MG/5ML PO SUSP: 30.0000 mL | Freq: Four times a day (QID) | ORAL | Status: DC | PRN

## 2014-07-18 MED ORDER — ACETAMINOPHEN 325 MG PO TABS
650.0000 mg | ORAL_TABLET | Freq: Four times a day (QID) | ORAL | Status: DC | PRN
  Administered 2014-07-19: 650 mg via ORAL
  Filled 2014-07-18: qty 2

## 2014-07-18 MED ORDER — SENNOSIDES-DOCUSATE SODIUM 8.6-50 MG PO TABS: 1.0000 | ORAL_TABLET | Freq: Every evening | ORAL | Status: DC | PRN

## 2014-07-18 MED ORDER — NITROGLYCERIN 0.4 MG SL SUBL
0.4000 mg | SUBLINGUAL_TABLET | SUBLINGUAL | Status: DC | PRN
  Administered 2014-07-18 (×3): 0.4 mg via SUBLINGUAL
  Filled 2014-07-18: qty 1

## 2014-07-18 MED ORDER — ASPIRIN 81 MG PO CHEW
324.0000 mg | CHEWABLE_TABLET | Freq: Once | ORAL | Status: AC
  Administered 2014-07-18: 324 mg via ORAL
  Filled 2014-07-18: qty 4

## 2014-07-18 MED ORDER — SODIUM CHLORIDE 0.9 % IJ SOLN
3.0000 mL | Freq: Two times a day (BID) | INTRAMUSCULAR | Status: DC
  Administered 2014-07-19: 3 mL via INTRAVENOUS

## 2014-07-18 MED ORDER — SODIUM CHLORIDE 0.9 % IV BOLUS (SEPSIS)
500.0000 mL | Freq: Once | INTRAVENOUS | Status: AC
  Administered 2014-07-18: 500 mL via INTRAVENOUS

## 2014-07-18 MED ORDER — LEVOTHYROXINE SODIUM 50 MCG PO TABS
50.0000 ug | ORAL_TABLET | Freq: Every day | ORAL | Status: DC
  Administered 2014-07-19: 50 ug via ORAL
  Filled 2014-07-18: qty 1

## 2014-07-18 MED ORDER — PANTOPRAZOLE SODIUM 40 MG PO TBEC
40.0000 mg | DELAYED_RELEASE_TABLET | Freq: Every day | ORAL | Status: DC
  Administered 2014-07-19: 40 mg via ORAL
  Filled 2014-07-18: qty 1

## 2014-07-18 MED ORDER — ASPIRIN EC 81 MG PO TBEC
81.0000 mg | DELAYED_RELEASE_TABLET | Freq: Every day | ORAL | Status: DC
  Administered 2014-07-19: 81 mg via ORAL
  Filled 2014-07-18: qty 1

## 2014-07-18 MED ORDER — ONDANSETRON HCL 4 MG/2ML IJ SOLN: 4.0000 mg | Freq: Four times a day (QID) | INTRAMUSCULAR | Status: DC | PRN

## 2014-07-18 MED ORDER — ONDANSETRON HCL 4 MG PO TABS: 4.0000 mg | ORAL_TABLET | Freq: Four times a day (QID) | ORAL | Status: DC | PRN

## 2014-07-18 MED ORDER — ENOXAPARIN SODIUM 40 MG/0.4ML ~~LOC~~ SOLN
40.0000 mg | SUBCUTANEOUS | Status: DC
  Administered 2014-07-18: 40 mg via SUBCUTANEOUS
  Filled 2014-07-18: qty 0.4

## 2014-07-18 NOTE — ED Provider Notes (Signed)
CSN: [id number]     Arrival date & time 07/18/14  [id number] History   First MD Initiated Contact with Patient 07/18/14 1002     Chief Complaint  Patient presents with  . Shortness of Breath     (Consider location/radiation/quality/duration/timing/severity/associated sxs/prior Treatment) HPI Comments: Patient from [place] presents with chest discomfort and shortness of breath. History is obtained through her daughter who is interpreting. She's been having some exertional shortness of breath for the last week. Last night she noticed some discomfort which she describes as a cloudiness across her chest and some discomfort in her left arm. She states the left arm discomfort has been off and on for the last week however the chest discomfort just started last night and it was associated with increasing shortness of breath and nausea. It's been waxing and waning since last night. She's had some nausea but no diaphoresis or vomiting. She states the arm pain is not related to movement. She last traveled from [place] in September. She denies any pleuritic pain. She denies any leg pain or swelling. She does not have any established primary care physicians here other than an endocrinologist, Dr. Sharl MaKerr. She has an appointment with [facility] next month. She doesn't have any known past heart disease. She has a history of hypertension but has not been on any medications.  Patient is a [age] y.o. female presenting with shortness of breath.  Shortness of Breath Associated symptoms: chest pain   Associated symptoms: no abdominal pain, no cough, no diaphoresis, no fever, no headaches, no rash and no vomiting     Past Medical History  Diagnosis Date  . Tachycardia   . Thyroid disease   . Hypertension    History reviewed. No pertinent past surgical history. Family History  Problem Relation Age of Onset  . Thyroid disease Daughter    History  Substance Use Topics  . Smoking status: Never Smoker   .  Smokeless tobacco: Not on file  . Alcohol Use: No   OB History    No data available     Review of Systems  Constitutional: Negative for fever, chills, diaphoresis and fatigue.  HENT: Negative for congestion, rhinorrhea and sneezing.   Eyes: Negative.   Respiratory: Positive for shortness of breath. Negative for cough and chest tightness.   Cardiovascular: Positive for chest pain. Negative for leg swelling.  Gastrointestinal: Positive for nausea. Negative for vomiting, abdominal pain, diarrhea and blood in stool.  Genitourinary: Negative for frequency, hematuria, flank pain and difficulty urinating.  Musculoskeletal: Negative for back pain and arthralgias.  Skin: Negative for rash.  Neurological: Negative for dizziness, speech difficulty, weakness, numbness and headaches.      Allergies  Review of patient's allergies indicates no known allergies.  Home Medications   Prior to Admission medications   Medication Sig Start Date End Date Taking? Authorizing Provider  aspirin EC 81 MG tablet Take 81 mg by mouth daily.   Yes Historical Provider, MD  levothyroxine (SYNTHROID, LEVOTHROID) 50 MCG tablet Take 50 mcg by mouth daily before breakfast.   Yes Historical Provider, MD  gabapentin (NEURONTIN) 100 MG capsule Take 100 mg by mouth at bedtime.    Historical Provider, MD  HYDROcodone-acetaminophen (NORCO/VICODIN) 5-325 MG per tablet Take 1-2 tablets by mouth every 4 (four) hours as needed for moderate pain.    Historical Provider, MD  ondansetron (ZOFRAN-ODT) 8 MG disintegrating tablet Take 1 tablet (8 mg total) by mouth every 8 (eight) hours as needed for nausea or vomiting.  06/06/14   Linwood DibblesJon Knapp, MD  oxyCODONE-acetaminophen (PERCOCET/ROXICET) 5-325 MG per tablet Take 1 tablet by mouth every 4 (four) hours as needed for severe pain. 06/06/14   Linwood DibblesJon Knapp, MD  traMADol (ULTRAM) 50 MG tablet Take 50 mg by mouth every 6 (six) hours as needed for moderate pain.    Historical Provider, MD   valACYclovir (VALTREX) 1000 MG tablet Take 1,000 mg by mouth 3 (three) times daily.    Historical Provider, MD   BP 115/67 mmHg  Pulse 80  Temp(Src) 99.1 F (37.3 C) (Oral)  Resp 98  Ht 5\' 2"  (1.575 m)  Wt 100 lb (45.36 kg)  BMI 18.29 kg/m2  SpO2 95% Physical Exam  Constitutional: She is oriented to person, place, and time. She appears well-developed and well-nourished.  HENT:  Head: Normocephalic and atraumatic.  Eyes: Pupils are equal, round, and reactive to light.  Neck: Normal range of motion. Neck supple.  Cardiovascular: Normal rate, regular rhythm and normal heart sounds.   Pulmonary/Chest: Effort normal and breath sounds normal. No respiratory distress. She has no wheezes. She has no rales. She exhibits no tenderness.  Abdominal: Soft. Bowel sounds are normal. There is no tenderness. There is no rebound and no guarding.  Musculoskeletal: Normal range of motion. She exhibits no edema.  No calf tenderness  Lymphadenopathy:    She has no cervical adenopathy.  Neurological: She is alert and oriented to person, place, and time.  Skin: Skin is warm and dry. No rash noted.  Psychiatric: She has a normal mood and affect.    ED Course  Procedures (including critical care time) Labs Review Labs Reviewed  COMPREHENSIVE METABOLIC PANEL - Abnormal; Notable for the following:    GFR calc non Af Amer 84 (*)    All other components within normal limits  TROPONIN I  CBC WITH DIFFERENTIAL  D-DIMER, QUANTITATIVE    Imaging Review Dg Chest 2 View  07/18/2014   CLINICAL DATA:  Shortness of breath x1 week, left chest pain x2 days  EXAM: CHEST - 2 VIEW  COMPARISON:  11/04/2010  FINDINGS: Lungs are clear. Heart size and mediastinal contours are within normal limits. No effusion.  No pneumothorax.  Atheromatous aortic arch. Degenerative disc disease in the visualized upper lumbar spine.  IMPRESSION: No acute cardiopulmonary disease.   Electronically Signed   By: Oley Balmaniel  Hassell M.D.    On: 07/18/2014 10:47     EKG Interpretation   Date/Time:  Saturday July 18 2014 09:55:12 EST Ventricular Rate:  72 PR Interval:  146 QRS Duration: 78 QT Interval:  388 QTC Calculation: 424 R Axis:   75 Text Interpretation:  Normal sinus rhythm with sinus arrhythmia Normal ECG  Since last tracing rate slower Confirmed by Emory Gallentine  MD, Kaylene Dawn ([id number]) on  07/18/2014 10:14:22 AM      MDM   Final diagnoses:  Chest pain, unspecified chest pain type    Patient is chest pain-free after nitroglycerin. She was given aspirin as well.  Her EKG does not show acute ischemia.  I spoke with Dr. Arbutus Leasat who has accepted patient [facility].   Rolan BuccoMelanie Jet Traynham, MD 07/18/14 1210

## 2014-07-18 NOTE — H&P (Addendum)
[facility] History and Physical  Katelyn Bennett ZHY:[id number] DOB: Dec 22, 1940 DOA: 07/18/2014  Referring physician: EDP PCP: Talmage CoinKERR,JEFFREY, MD   Chief Complaint: EDP  HPI: Katelyn Bennett is a 73 y.o. female  [name] with h/o hypothyroidism after radioiodine ablation, hypertension, not currently on medications, recent shingles who came to [facility] with complaints of central chest discomfort. She speaks no English so patient's daughter provides history. She describes the discomfort as central, "like smoke". Occurred at rest. Radiated to left arm. Resolved after sublingual nitroglycerin.  No history of heart disease. Patient thought it might be gas pains and daughter noted her to be belching. She had slight nausea as well. No previous cardiac workup. Daughter also reports patient has been dyspneic on exertion over the past few weeks.  Denies leg swelling or pain. No fevers chills vomiting diarrhea cough. No orthopnea or paroxysmal nocturnal dyspnea.  In the ED, EKG troponin unremarkable. Chest x-ray shows nothing acute. D-dimer normal.  Review of Systems:  As per history of present illness. Complete review of systems otherwise negative.  Past Medical History  Diagnosis Date  . Tachycardia   . Thyroid disease   . Hypertension    History reviewed. No pertinent past surgical history. Social History:  reports that she has never smoked. She does not have any smokeless tobacco history on file. She reports that she does not drink alcohol or use illicit drugs.  No Known Allergies  Family History  Problem Relation Age of Onset  . Thyroid disease Daughter      Prior to Admission medications   Medication Sig Start Date End Date Taking? Authorizing Provider  aspirin EC 81 MG tablet Take 81 mg by mouth daily.   Yes Historical Provider, MD  levothyroxine (SYNTHROID, LEVOTHROID) 50 MCG tablet Take 50 mcg by mouth daily before breakfast.   Yes Historical Provider, MD    Physical Exam: Filed Vitals:   07/18/14 1148 07/18/14 1212 07/18/14 1224 07/18/14 1252  BP: 115/67 122/64 109/60 109/60  Pulse: 80 67 67 67  Temp:      TempSrc:      Resp: 98 18 18 18   Height:      Weight:      SpO2: 95% 100% 99% 99%    Wt Readings from Last 3 Encounters:  07/18/14 45.36 kg (100 lb)  06/05/14 55.991 kg (123 lb 7 oz)   BP 109/60 mmHg  Pulse 67  Temp(Src) 99.1 F (37.3 C) (Oral)  Resp 18  Ht 5\' 2"  (1.575 m)  Wt 45.36 kg (100 lb)  BMI 18.29 kg/m2  SpO2 99%  General Appearance:    Alert, cooperative, no distress, appears stated age  Head:    Normocephalic, without obvious abnormality, atraumatic  Eyes:    PERRL, conjunctiva/corneas clear, EOM's intact,   Nose:   Nares normal, septum midline, mucosa normal, no drainage    or sinus tenderness  Throat:   Lips, mucosa, and tongue normal; teeth and gums normal  Neck:   Supple, symmetrical, trachea midline, no adenopathy;    thyroid:  no enlargement/tenderness/nodules; no carotid   bruit or JVD  Back:     Symmetric, no curvature, ROM normal, no CVA tenderness  Lungs:     Clear to auscultation bilaterally, respirations unlabored  Chest Wall:    No tenderness or deformity   Heart:    Regular rate and rhythm, S1 and S2 normal, no murmur, rub   or gallop  Abdomen:     Soft, non-tender,  bowel sounds active all four quadrants,    no masses, no organomegaly  Genitalia:    deferred   Rectal:    deferred   Extremities:   Extremities normal, atraumatic, no cyanosis or edema  Pulses:   2+ and symmetric all extremities  Skin:   Skin color, texture, turgor normal, no rashes or lesions  Lymph nodes:   Cervical, supraclavicular, and axillary nodes normal  Neurologic:   CNII-XII intact, normal strength, sensation and reflexes    throughout    Psych: normal affect        Labs on Admission:  Basic Metabolic Panel:  Recent Labs Lab 07/18/14 1000  NA 143  K 4.2  CL 104  CO2 26  GLUCOSE 96  BUN 9  CREATININE  0.70  CALCIUM 9.7   Liver Function Tests:  Recent Labs Lab 07/18/14 1000  AST 21  ALT 11  ALKPHOS 96  BILITOT 0.9  PROT 8.2  ALBUMIN 4.2   No results for input(s): LIPASE, AMYLASE in the last 168 hours. No results for input(s): AMMONIA in the last 168 hours. CBC:  Recent Labs Lab 07/18/14 1000  WBC 8.4  NEUTROABS 6.2  HGB 13.3  HCT 42.1  MCV 89.2  PLT 294   Cardiac Enzymes:  Recent Labs Lab 07/18/14 1000  TROPONINI <0.30    BNP (last 3 results) No results for input(s): PROBNP in the last 8760 hours. CBG: No results for input(s): GLUCAP in the last 168 hours.  Radiological Exams on Admission: Dg Chest 2 View  07/18/2014   CLINICAL DATA:  Shortness of breath x1 week, left chest pain x2 days  EXAM: CHEST - 2 VIEW  COMPARISON:  11/04/2010  FINDINGS: Lungs are clear. Heart size and mediastinal contours are within normal limits. No effusion.  No pneumothorax.  Atheromatous aortic arch. Degenerative disc disease in the visualized upper lumbar spine.  IMPRESSION: No acute cardiopulmonary disease.   Electronically Signed   By: Oley Balmaniel  Hassell M.D.   On: 07/18/2014 10:47    EKG: NSR  Assessment/Plan Principal Problem:   Chest pain:  Atypical. Workup thus far negative. May be GI related. Will observe on telemetry. Rule out MI. Check echocardiogram. Start empiric protonix. Continue aspirin. Active Problems:   Hypothyroidism: Check TSH   History of hypertension, not on medications.   Code Status: Full Family Communication: daughter at bedside Disposition Plan: observation, tele  Time spent: 6560  Kisean Rollo L [facility] Text page [email or web address] password [email or web address]

## 2014-07-18 NOTE — ED Notes (Signed)
Report given to [facility]. 

## 2014-07-18 NOTE — ED Notes (Signed)
[facility] arrived to transport pt 

## 2014-07-18 NOTE — ED Notes (Signed)
[facility].

## 2014-07-18 NOTE — ED Notes (Addendum)
C/o sob x 1 week and increased last pm. Also c/o left arm pain and mid chest pain. Nausea. No diaphoresis or vomiting. Pt states left arm hurts worse with movement and has been hurting x 2 weeks. Also c/o  Left clavicle pain.

## 2014-07-18 NOTE — Progress Notes (Signed)
73 y/o female visiting family here from [place] since sept 2015 c/o 1 week exertional sob.  Last night developed substernal cp with radiation to L-arm and nausea. EKG normal sinus without ST-T changes.  CP relieved with NTG in ED.  Now CP free.  BP stable.  Initial troponin neg and CXR is neg Accepted to tele bed. DTat

## 2014-07-18 NOTE — ED Notes (Signed)
Attempt to call report to 3W.

## 2014-07-19 DIAGNOSIS — I369 Nonrheumatic tricuspid valve disorder, unspecified: Secondary | ICD-10-CM

## 2014-07-19 DIAGNOSIS — R0789 Other chest pain: Secondary | ICD-10-CM

## 2014-07-19 LAB — TROPONIN I

## 2014-07-19 MED ORDER — ALUM & MAG HYDROXIDE-SIMETH 200-200-20 MG/5ML PO SUSP: 30.0000 mL | Freq: Four times a day (QID) | ORAL | Status: AC | PRN

## 2014-07-19 NOTE — Plan of Care (Signed)
Problem: Discharge Progression Outcomes Goal: No anginal pain Outcome: Completed/Met Date Met:  07/19/14 Goal: Hemodynamically stable Outcome: Completed/Met Date Met:  02/66/91 Goal: Complications resolved/controlled Outcome: Completed/Met Date Met:  07/19/14 Goal: Barriers To Progression Addressed/Resolved Outcome: Completed/Met Date Met:  07/19/14 Goal: Discharge plan in place and appropriate Outcome: Completed/Met Date Met:  07/19/14 Goal: Vascular site scale level 0 - I Vascular Site Scale Level 0: No bruising/bleeding/hematoma Level I (Mild): Bruising/Ecchymosis, minimal bleeding/ooozing, palpable hematoma < 3 cm Level II (Moderate): Bleeding not affecting hemodynamic parameters, pseudoaneurysm, palpable hematoma > 3 cm Level III (Severe) Bleeding which affects hemodynamic parameters or retroperitoneal hemorrhage  Outcome: Completed/Met Date Met:  07/19/14 Goal: Tolerates diet Outcome: Completed/Met Date Met:  07/19/14 Goal: Activity appropriate for discharge plan Outcome: Completed/Met Date Met:  07/19/14 Goal: Other Discharge Outcomes/Goals Outcome: Completed/Met Date Met:  07/19/14

## 2014-07-19 NOTE — Discharge Summary (Signed)
Physician Discharge Summary  Katelyn Bennett [id number] DOB: [birth year] DOA: 07/18/2014  PCP: Katelyn Bennett  Admit date: 07/18/2014 Discharge date: 07/19/2014  Time spent: 25 minutes  Recommendations for Outpatient Follow-up:  1. D/c home with outpt follow up  Discharge Diagnoses:  Principal Problem:   Chest pain, epigastric  Active Problems:   Hypothyroidism   History of hypertension   Discharge Condition: fair  Diet recommendation: low sodium  Filed Weights   07/18/14 0956 07/19/14 0517  Weight: 45.36 kg (100 lb) 54.432 kg (120 lb)    History of present illness:  73 y.o. female Katelyn Bennett female with h/o hypothyroidism after radioiodine ablation, hypertension, not currently on medications, recent shingles who came to [facility] [place] [facility] with complaints of central chest discomfort. History taken today in her native language. She reports 3 days of dull aching left arm pain which improved with massaging it. On the day of admission she developed some central chest heaviness feeling like some smoke in her chest. Also felt fatigued with routine activity. She reports the chest discomfort and left arm pain to be unrelated. Marland Kitchen. Resolved after sublingual nitroglycerin. No history of heart disease. No family hx of heart disease. Patient thought it might be gas pains and daughter noted her to be belching.  No previous cardiac workup. Daughter also reports patient has been dyspneic on exertion over the past few weeks. Denies leg swelling or pain. No fevers chills , nausea, vomiting ,diarrhea or cough. No orthopnea or paroxysmal nocturnal dyspnea. In the ED, EKG and troponin unremarkable. Chest x-ray shows nothing acute. D-dimer normal.    Hospital Course:  Chest pain  clinically appears to be gaseous discomfort vs GERD. Stable on telemetry. Serial EKG and troponin negative. Her HEART score is quite low. No further symptoms since admission. 2 D echo with normal EF and no wall motion  abnormality. i will prescribe her maalox for prn use.  -pateint visiting her daughter for few months from [place]. Has an appt to see an Bennett at [facility] in next 2 weeks.  hypothyroidism  mildly elevated TSH. Continue current dose of synthroid and follow as outpt      Procedures:  2D ECHO  Consultations:  none  Discharge Exam: Filed Vitals:   07/19/14 1425  BP: 131/54  Pulse: 77  Temp: 98 F (36.7 C)  Resp: 16    General: elderly female in NAD  HEENT: no pallor, moist mucosa  chest: clear b/l, no added sounds  CVS; N S1&S2, no murmurs Abd: soft, NT, ND, Ext: warm, no edema  Discharge Instructions You were cared for by a hospitalist during your hospital stay. If you have any questions about your discharge medications or the care you received while you were in the hospital after you are discharged, you can call the unit and asked to speak with the hospitalist on call if the hospitalist that took care of you is not available. Once you are discharged, your primary care physician will handle any further medical issues. Please note that NO REFILLS for any discharge medications will be authorized once you are discharged, as it is imperative that you return to your primary care physician (or establish a relationship with a primary care physician if you do not have one) for your aftercare needs so that they can reassess your need for medications and monitor your lab values.   Current Discharge Medication List    START taking these medications   Details  alum & mag hydroxide-simeth (MAALOX/MYLANTA) 200-200-20 MG/5ML suspension  Take 30 mLs by mouth every 6 (six) hours as needed for indigestion or heartburn (dyspepsia). Qty: 355 mL, Refills: 0      CONTINUE these medications which have NOT CHANGED   Details  aspirin EC 81 MG tablet Take 81 mg by mouth daily.    levothyroxine (SYNTHROID, LEVOTHROID) 50 MCG tablet Take 50 mcg by mouth daily before breakfast.       No Known  Allergies    The results of significant diagnostics from this hospitalization (including imaging, microbiology, ancillary and laboratory) are listed below for reference.    Significant Diagnostic Studies: Dg Chest 2 View  07/18/2014   CLINICAL DATA:  Shortness of breath x1 week, left chest pain x2 days  EXAM: CHEST - 2 VIEW  COMPARISON:  11/04/2010  FINDINGS: Lungs are clear. Heart size and mediastinal contours are within normal limits. No effusion.  No pneumothorax.  Atheromatous aortic arch. Degenerative disc disease in the visualized upper lumbar spine.  IMPRESSION: No acute cardiopulmonary disease.   Electronically Signed   By: Katelyn Balmaniel  Hassell M.D.   On: 07/18/2014 10:47    Microbiology: No results found for this or any previous visit (from the past 240 hour(s)).   Labs: Basic Metabolic Panel:  Recent Labs Lab 07/18/14 1000  NA 143  K 4.2  CL 104  CO2 26  GLUCOSE 96  BUN 9  CREATININE 0.70  CALCIUM 9.7   Liver Function Tests:  Recent Labs Lab 07/18/14 1000  AST 21  ALT 11  ALKPHOS 96  BILITOT 0.9  PROT 8.2  ALBUMIN 4.2   No results for input(s): LIPASE, AMYLASE in the last 168 hours. No results for input(s): AMMONIA in the last 168 hours. CBC:  Recent Labs Lab 07/18/14 1000  WBC 8.4  NEUTROABS 6.2  HGB 13.3  HCT 42.1  MCV 89.2  PLT 294   Cardiac Enzymes:  Recent Labs Lab 07/18/14 1000 07/18/14 1630 07/18/14 2102 07/19/14 0253  TROPONINI <0.30 <0.30 <0.30 <0.30   BNP: BNP (last 3 results)  Recent Labs  07/18/14 1630  PROBNP <5.0   CBG:  Recent Labs Lab 07/18/14 1930  GLUCAP 86       Signed:  Remingtyn Bennett  [facility] 07/19/2014, 2:45 PM

## 2014-07-19 NOTE — Progress Notes (Signed)
Utilization Review Completed.   Yehoshua Vitelli, RN, BSN Nurse Case Manager  

## 2014-07-19 NOTE — Progress Notes (Signed)
  Echocardiogram 2D Echocardiogram has been performed.  Arvil ChacoFoster, [place] Strike 07/19/2014, 10:55 AM

## 2014-11-17 ENCOUNTER — Emergency Department (HOSPITAL_BASED_OUTPATIENT_CLINIC_OR_DEPARTMENT_OTHER): Payer: Medicaid Other

## 2014-11-17 ENCOUNTER — Encounter (HOSPITAL_BASED_OUTPATIENT_CLINIC_OR_DEPARTMENT_OTHER): Payer: Self-pay | Admitting: *Deleted

## 2014-11-17 ENCOUNTER — Emergency Department (HOSPITAL_BASED_OUTPATIENT_CLINIC_OR_DEPARTMENT_OTHER)
Admission: EM | Admit: 2014-11-17 | Discharge: 2014-11-17 | Disposition: A | Discharge status: Home / Self Care | Payer: Medicaid Other | Attending: Emergency Medicine | Admitting: Emergency Medicine

## 2014-11-17 DIAGNOSIS — Z7982 Long term (current) use of aspirin: Secondary | ICD-10-CM | POA: Insufficient documentation

## 2014-11-17 DIAGNOSIS — R202 Paresthesia of skin: Secondary | ICD-10-CM | POA: Diagnosis not present

## 2014-11-17 DIAGNOSIS — I10 Essential (primary) hypertension: Secondary | ICD-10-CM | POA: Diagnosis not present

## 2014-11-17 DIAGNOSIS — Z79899 Other long term (current) drug therapy: Secondary | ICD-10-CM | POA: Insufficient documentation

## 2014-11-17 DIAGNOSIS — E079 Disorder of thyroid, unspecified: Secondary | ICD-10-CM | POA: Diagnosis not present

## 2014-11-17 DIAGNOSIS — R200 Anesthesia of skin: Secondary | ICD-10-CM

## 2014-11-17 LAB — CBC WITH DIFFERENTIAL/PLATELET
BASOS ABS: 0 10*3/uL (ref 0.0–0.1)
Basophils Relative: 0 % (ref 0–1)
EOS PCT: 1 % (ref 0–5)
Eosinophils Absolute: 0.1 10*3/uL (ref 0.0–0.7)
HCT: 42.8 % (ref 36.0–46.0)
Hemoglobin: 13.5 g/dL (ref 12.0–15.0)
LYMPHS ABS: 1.8 10*3/uL (ref 0.7–4.0)
Lymphocytes Relative: 24 % (ref 12–46)
MCH: 27.7 pg (ref 26.0–34.0)
MCHC: 31.5 g/dL (ref 30.0–36.0)
MCV: 87.7 fL (ref 78.0–100.0)
MONO ABS: 0.3 10*3/uL (ref 0.1–1.0)
MONOS PCT: 4 % (ref 3–12)
NEUTROS ABS: 5.2 10*3/uL (ref 1.7–7.7)
Neutrophils Relative %: 71 % (ref 43–77)
Platelets: 307 10*3/uL (ref 150–400)
RBC: 4.88 MIL/uL (ref 3.87–5.11)
RDW: 13.5 % (ref 11.5–15.5)
WBC: 7.4 10*3/uL (ref 4.0–10.5)

## 2014-11-17 LAB — BASIC METABOLIC PANEL
Anion gap: 9 (ref 5–15)
BUN: 11 mg/dL (ref 6–23)
CO2: 26 mmol/L (ref 19–32)
Calcium: 9.4 mg/dL (ref 8.4–10.5)
Chloride: 106 mmol/L (ref 96–112)
Creatinine, Ser: 0.74 mg/dL (ref 0.50–1.10)
GFR calc non Af Amer: 82 mL/min — ABNORMAL LOW (ref >90)
GLUCOSE: 134 mg/dL — AB (ref 70–99)
Potassium: 4 mmol/L (ref 3.5–5.1)
SODIUM: 141 mmol/L (ref 135–145)

## 2014-11-17 NOTE — ED Notes (Addendum)
States started on antibacterial for stomach infection 4 days ago and yesterday states she felt disoriented. This morning woke with tingling in bil arm from shoulders to fingers and top of head. Now no tingling in arms but does have tingling in top of head but no h/a. C/o slight nausea. Denies loosing strength in extremeties. No vision problems. No hx of same.

## 2014-11-17 NOTE — ED Notes (Signed)
Patient transported to CT 

## 2014-11-17 NOTE — Discharge Instructions (Signed)
Workup for the numbness without any significant findings. As we discussed do not feel that the antibiotic she is taking is related to her symptoms. Continue take the antibiotic. Follow-up with her doctor. Return for any new or worse symptoms.

## 2014-11-17 NOTE — ED Provider Notes (Signed)
CSN: [id number]     Arrival date & time 11/17/14  0910 History   First MD Initiated Contact with Patient 11/17/14 [phone]     No chief complaint on file.    (Consider location/radiation/quality/duration/timing/severity/associated sxs/prior Treatment) The history is provided by the patient and a relative.   74 year old female with a language barrier daughter able to communicate well with patient. Patient recently started on antibiotic for a stomach infection. Think it may be treating peptic ulcer disease. Antibody was started 4 days ago. Starting yesterday patient stated she started to have a strange feeling. If occult for her to describe no significant pain best description seem to fit a numbness and tingling feeling on the top of her head and both arms. The arm has resolved. Patient's main concern was a thought it was related to the antibiotic they contacted her doctor he felt that it was not consistent with side effects or allergic reaction. Patient without any speech problems no fever no chest pain no shortness of breath no motor weakness no trouble walking.  Past Medical History  Diagnosis Date  . Tachycardia   . Thyroid disease   . Hypertension    History reviewed. No pertinent past surgical history. Family History  Problem Relation Age of Onset  . Thyroid disease Daughter    History  Substance Use Topics  . Smoking status: Never Smoker   . Smokeless tobacco: Not on file  . Alcohol Use: No   OB History    No data available     Review of Systems  Constitutional: Negative for fever.  HENT: Negative for congestion.   Eyes: Negative for visual disturbance.  Respiratory: Negative for shortness of breath.   Cardiovascular: Negative for chest pain.  Gastrointestinal: Negative for abdominal pain.  Genitourinary: Negative for dysuria.  Musculoskeletal: Negative for back pain.  Skin: Negative for rash.  Neurological: Positive for numbness. Negative for weakness and headaches.   Hematological: Does not bruise/bleed easily.  Psychiatric/Behavioral: Negative for confusion.      Allergies  Review of patient's allergies indicates no known allergies.  Home Medications   Prior to Admission medications   Medication Sig Start Date End Date Taking? Authorizing Provider  aspirin EC 81 MG tablet Take 81 mg by mouth daily.   Yes Historical Provider, MD  bismuth-metronidazole-tetracycline (PYLERA) [phone] MG per capsule Take 3 capsules by mouth 4 (four) times daily.   Yes Historical Provider, MD  levothyroxine (SYNTHROID, LEVOTHROID) 50 MCG tablet Take 50 mcg by mouth daily before breakfast.   Yes Historical Provider, MD  omeprazole (PRILOSEC) 20 MG capsule Take 20 mg by mouth 2 (two) times daily before a meal.   Yes Historical Provider, MD  alum & mag hydroxide-simeth (MAALOX/MYLANTA) 200-200-20 MG/5ML suspension Take 30 mLs by mouth every 6 (six) hours as needed for indigestion or heartburn (dyspepsia). 07/19/14   Nishant Dhungel, MD   BP 155/73 mmHg  Pulse 81  Temp(Src) 97.9 F (36.6 C) (Oral)  Resp 16  Ht 5' (1.524 m)  Wt 120 lb (54.432 kg)  BMI 23.44 kg/m2  SpO2 100% Physical Exam  Constitutional: She is oriented to person, place, and time. She appears well-developed and well-nourished. No distress.  HENT:  Head: Normocephalic and atraumatic.  Mouth/Throat: Oropharynx is clear and moist.  Eyes: Conjunctivae and EOM are normal. Pupils are equal, round, and reactive to light.  Neck: Normal range of motion.  Cardiovascular: Normal rate, regular rhythm and normal heart sounds.   No murmur heard. Pulmonary/Chest: Effort normal  and breath sounds normal. No respiratory distress.  Abdominal: Soft. Bowel sounds are normal. There is no tenderness.  Musculoskeletal: Normal range of motion. She exhibits no edema.  Neurological: She is alert and oriented to person, place, and time. No cranial nerve deficit. She exhibits normal muscle tone. Coordination normal.   Skin: Skin is warm. No rash noted.  Nursing note and vitals reviewed.   ED Course  Procedures (including critical care time) Labs Review Labs Reviewed  BASIC METABOLIC PANEL - Abnormal; Notable for the following:    Glucose, Bld 134 (*)    GFR calc non Af Amer 82 (*)    All other components within normal limits  CBC WITH DIFFERENTIAL/PLATELET    Imaging Review Ct Head Wo Contrast  11/17/2014   CLINICAL DATA:  Patient awoke with tingling in the BILATERAL arms. Slight nausea. History of hypertension. Initial encounter.  EXAM: CT HEAD WITHOUT CONTRAST  TECHNIQUE: Contiguous axial images were obtained from the base of the skull through the vertex without intravenous contrast.  COMPARISON:  MR brain 10/08/2014.  CT head 11/05/2010.  FINDINGS: No evidence for acute infarction, hemorrhage, mass lesion, hydrocephalus, or extra-axial fluid. Mild cerebral and cerebellar atrophy, not unexpected for age. No significant white matter disease. No CT signs of proximal vascular thrombosis.  Calvarium is intact.  No sinus or mastoid disease.  IMPRESSION: Unremarkable noncontrast CT head.  Similar appearance to priors.   Electronically Signed   By: Davonna Belling M.D.   On: 11/17/2014 10:23     EKG Interpretation None      MDM   Final diagnoses:  Numbness and tingling    Patient with concerns of numbness and tingling in both upper arms which is now resolved. And some tingling on the top of her head. Does not seem to fit an acute stroke type pattern. Patient thought it was related to an antibiotic she started 4 days ago. I think this antibiotic is treating a peptic ulcer.  Patient without any other significant symptoms. Head CT is negative basic labs are negative. Patient stable for discharge home and follow-up with her doctor. Also no CT or headache no significant vital sign abnormalities.    Vanetta Mulders, MD 11/17/14 1057

## 2014-11-17 NOTE — ED Notes (Signed)
MD at bedside. 

## 2016-03-09 ENCOUNTER — Emergency Department (HOSPITAL_BASED_OUTPATIENT_CLINIC_OR_DEPARTMENT_OTHER): Payer: Medicaid Other

## 2016-03-09 ENCOUNTER — Emergency Department (HOSPITAL_BASED_OUTPATIENT_CLINIC_OR_DEPARTMENT_OTHER)
Admission: EM | Admit: 2016-03-09 | Discharge: 2016-03-09 | Disposition: A | Discharge status: Home / Self Care | Payer: Medicaid Other | Attending: Emergency Medicine | Admitting: Emergency Medicine

## 2016-03-09 ENCOUNTER — Encounter (HOSPITAL_BASED_OUTPATIENT_CLINIC_OR_DEPARTMENT_OTHER): Payer: Self-pay

## 2016-03-09 DIAGNOSIS — E785 Hyperlipidemia, unspecified: Secondary | ICD-10-CM | POA: Diagnosis not present

## 2016-03-09 DIAGNOSIS — E039 Hypothyroidism, unspecified: Secondary | ICD-10-CM | POA: Insufficient documentation

## 2016-03-09 DIAGNOSIS — A084 Viral intestinal infection, unspecified: Secondary | ICD-10-CM | POA: Diagnosis not present

## 2016-03-09 DIAGNOSIS — I10 Essential (primary) hypertension: Secondary | ICD-10-CM | POA: Insufficient documentation

## 2016-03-09 DIAGNOSIS — R112 Nausea with vomiting, unspecified: Secondary | ICD-10-CM

## 2016-03-09 DIAGNOSIS — Z5181 Encounter for therapeutic drug level monitoring: Secondary | ICD-10-CM | POA: Diagnosis not present

## 2016-03-09 DIAGNOSIS — Z7982 Long term (current) use of aspirin: Secondary | ICD-10-CM | POA: Diagnosis not present

## 2016-03-09 DIAGNOSIS — R42 Dizziness and giddiness: Secondary | ICD-10-CM | POA: Diagnosis present

## 2016-03-09 HISTORY — DX: Pure hypercholesterolemia, unspecified: E78.00

## 2016-03-09 HISTORY — DX: Gastro-esophageal reflux disease without esophagitis: K21.9

## 2016-03-09 LAB — COMPREHENSIVE METABOLIC PANEL
ALT: 15 U/L (ref 14–54)
AST: 25 U/L (ref 15–41)
Albumin: 4.1 g/dL (ref 3.5–5.0)
Alkaline Phosphatase: 71 U/L (ref 38–126)
Anion gap: 7 (ref 5–15)
BILIRUBIN TOTAL: 1.2 mg/dL (ref 0.3–1.2)
BUN: 15 mg/dL (ref 6–20)
CALCIUM: 9.1 mg/dL (ref 8.9–10.3)
CHLORIDE: 105 mmol/L (ref 101–111)
CO2: 24 mmol/L (ref 22–32)
Creatinine, Ser: 0.56 mg/dL (ref 0.44–1.00)
GFR calc non Af Amer: >60 mL/min (ref >60)
Glucose, Bld: 103 mg/dL — ABNORMAL HIGH (ref 65–99)
Potassium: 3.7 mmol/L (ref 3.5–5.1)
Sodium: 136 mmol/L (ref 135–145)
Total Protein: 7.5 g/dL (ref 6.5–8.1)

## 2016-03-09 LAB — DIFFERENTIAL
BASOS PCT: 0 %
Basophils Absolute: 0 10*3/uL (ref 0.0–0.1)
Eosinophils Absolute: 0.1 10*3/uL (ref 0.0–0.7)
Eosinophils Relative: 2 %
LYMPHS ABS: 3.3 10*3/uL (ref 0.7–4.0)
Lymphocytes Relative: 48 %
MONO ABS: 0.5 10*3/uL (ref 0.1–1.0)
Monocytes Relative: 6 %
Neutro Abs: 3.1 10*3/uL (ref 1.7–7.7)
Neutrophils Relative %: 44 %

## 2016-03-09 LAB — CBC
HEMATOCRIT: 40.6 % (ref 36.0–46.0)
HEMOGLOBIN: 13.2 g/dL (ref 12.0–15.0)
MCH: 27.9 pg (ref 26.0–34.0)
MCHC: 32.5 g/dL (ref 30.0–36.0)
MCV: 85.8 fL (ref 78.0–100.0)
Platelets: 306 10*3/uL (ref 150–400)
RBC: 4.73 MIL/uL (ref 3.87–5.11)
RDW: 13.8 % (ref 11.5–15.5)
WBC: 7 10*3/uL (ref 4.0–10.5)

## 2016-03-09 LAB — URINALYSIS, ROUTINE W REFLEX MICROSCOPIC
Bilirubin Urine: NEGATIVE
GLUCOSE, UA: NEGATIVE mg/dL
KETONES UR: NEGATIVE mg/dL
Leukocytes, UA: NEGATIVE
Nitrite: NEGATIVE
PH: 7.5 (ref 5.0–8.0)
PROTEIN: NEGATIVE mg/dL
Specific Gravity, Urine: 1.011 (ref 1.005–1.030)

## 2016-03-09 LAB — TROPONIN I: Troponin I: <0.03 ng/mL (ref <0.03)

## 2016-03-09 LAB — URINE MICROSCOPIC-ADD ON

## 2016-03-09 LAB — APTT: aPTT: 27 seconds (ref 24–37)

## 2016-03-09 LAB — PROTIME-INR
INR: 0.99 (ref 0.00–1.49)
Prothrombin Time: 13.3 seconds (ref 11.6–15.2)

## 2016-03-09 LAB — LIPASE, BLOOD: Lipase: 28 U/L (ref 11–51)

## 2016-03-09 MED ORDER — SODIUM CHLORIDE 0.9 % IV BOLUS (SEPSIS)
500.0000 mL | Freq: Once | INTRAVENOUS | Status: AC
  Administered 2016-03-09: 500 mL via INTRAVENOUS

## 2016-03-09 MED ORDER — ONDANSETRON HCL 4 MG/2ML IJ SOLN
4.0000 mg | Freq: Once | INTRAMUSCULAR | Status: AC
  Administered 2016-03-09: 4 mg via INTRAVENOUS
  Filled 2016-03-09: qty 2

## 2016-03-09 MED ORDER — ONDANSETRON HCL 4 MG PO TABS: 4.0000 mg | ORAL_TABLET | Freq: Three times a day (TID) | ORAL | Status: AC | PRN

## 2016-03-09 MED FILL — ONDANSETRON HCL 4 MG TABLET: 4 | 4 days supply | Qty: 10 | Fill #0

## 2016-03-09 NOTE — ED Notes (Signed)
Patient ambulatory to restroom without difficulty. Steady gait with standby assist.

## 2016-03-09 NOTE — ED Provider Notes (Signed)
CSN: [id number]     Arrival date & time 03/09/16  0932 History   First MD Initiated Contact with Patient 03/09/16 [phone]     Chief Complaint  Patient presents with  . Dizziness    (Consider location/radiation/quality/duration/timing/severity/associated sxs/prior Treatment) HPI   Katelyn Bennett is a 75-y/o female with history of hypothyroidism and hyperlipidemia who presents for dizziness. Patient's daughter translated for her mother who speaks Urdu. This morning patient was found lying on the floor in her room. Daughter says she heard a noise and went to check on her mother. Pt says she felt "weird" in her head and nauseated and sat down and then laid down. She did not hit her head. She was able to walk downstairs and make herself oatmeal earlier in the morning but had not been able to eat it. Daughter says she noticed her mother's hands and feet were tremoring when she walked into her room. Pt denies loss of bowel or bladder incontinence. Pt reports concern that she had been having trouble with her memory over the last few days. Daughter is concerned for stroke, as her mother had not been able to tolerate a statin medication due to GI upset and has not been on treatment for HLD. Pt denies weakness but was unable to walk due to sensation of nausea. She threw up x 1 prior to presenting to ED. She denies sensation of heat rushing over her. She may have experienced lightheadedness but is unsure. She still reports feeling nauseated and complains of a sensation of heat coming up from the back of her head. She denies dysuria, chest pain, or SOB.  Pt also notes she has a chronic sensation of fullness in her abdomen and has had decreased appetite for years. However, patient eats regular meals and has been drinking fluids well lately. Daughter also notes an uncle and a cousin who had trouble with their inner ears and had dizziness.   Past Medical History  Diagnosis Date  . Tachycardia   . Thyroid disease   .  Hypertension   . Hypercholesteremia   . GERD (gastroesophageal reflux disease)    History reviewed. No pertinent past surgical history. Family History  Problem Relation Age of Onset  . Thyroid disease Daughter    Social History  Substance Use Topics  . Smoking status: Never Smoker   . Smokeless tobacco: None  . Alcohol Use: No   OB History    No data available     Review of Systems  Constitutional: Negative for fever and chills.  HENT: Negative for ear pain and rhinorrhea.   Eyes: Negative for pain and visual disturbance.  Respiratory: Negative for cough and shortness of breath.   Cardiovascular: Negative for chest pain, palpitations and leg swelling.  Gastrointestinal: Positive for nausea and vomiting. Negative for abdominal pain, diarrhea and constipation.  Genitourinary: Negative for dysuria and urgency.  Musculoskeletal: Negative for back pain and neck pain.  Skin: Negative for rash.  Neurological: Negative for weakness, numbness and headaches.    Allergies  Review of patient's allergies indicates no known allergies.  Home Medications   Prior to Admission medications   Medication Sig Start Date End Date Taking? Authorizing Provider  alum & mag hydroxide-simeth (MAALOX/MYLANTA) 200-200-20 MG/5ML suspension Take 30 mLs by mouth every 6 (six) hours as needed for indigestion or heartburn (dyspepsia). 07/19/14   Nishant Dhungel, MD  aspirin EC 81 MG tablet Take 81 mg by mouth daily.    Historical Provider, MD  bismuth-metronidazole-tetracycline [facility](PYLERA)  140-125-125 MG per capsule Take 3 capsules by mouth 4 (four) times daily.    Historical Provider, MD  levothyroxine (SYNTHROID, LEVOTHROID) 50 MCG tablet Take 50 mcg by mouth daily before breakfast.    Historical Provider, MD  omeprazole (PRILOSEC) 20 MG capsule Take 20 mg by mouth 2 (two) times daily before a meal.    Historical Provider, MD  ondansetron (ZOFRAN) 4 MG tablet Take 1 tablet (4 mg total) by mouth every 8  (eight) hours as needed for nausea or vomiting. 03/09/16   Casey Burkitt, MD   BP 143/74 mmHg  Pulse 93  Temp(Src) 97.9 F (36.6 C) (Oral)  Resp 21  Ht 5\' 2"  (1.575 m)  Wt 54.432 kg  BMI 21.94 kg/m2  SpO2 98% Physical Exam  Constitutional: She appears well-developed and well-nourished. No distress.  HENT:  Head: Normocephalic and atraumatic.  Right Ear: External ear normal.  Left Ear: External ear normal.  Nose: Nose normal.  Mouth/Throat: Oropharynx is clear and moist.  R hearing aid in place.  Eyes: Conjunctivae and EOM are normal. Pupils are equal, round, and reactive to light.  Neck: Normal range of motion. Neck supple.  Cardiovascular: Normal rate, regular rhythm and normal heart sounds.   No murmur heard. Pulmonary/Chest: Effort normal and breath sounds normal. No respiratory distress. She has no wheezes. She has no rales.  Abdominal: Soft. Bowel sounds are normal. She exhibits no distension. There is no tenderness. There is no rebound and no guarding.  Musculoskeletal: Normal range of motion.  Neurological: She is alert.  Oriented to person, place, and month. CNII-XII intact though patient reports somewhat increased sense of pressure on L face when checking V2 and V3. Upper and lower extremity strength and sensation intact. Grip strength intact bilaterally.  Skin: She is not diaphoretic.  Nursing note and vitals reviewed.   ED Course  Procedures (including critical care time) Labs Review Labs Reviewed  URINALYSIS, ROUTINE W REFLEX MICROSCOPIC (NOT AT [facility]) - Abnormal; Notable for the following:    APPearance CLOUDY (*)    Hgb urine dipstick TRACE (*)    All other components within normal limits  COMPREHENSIVE METABOLIC PANEL - Abnormal; Notable for the following:    Glucose, Bld 103 (*)    All other components within normal limits  URINE MICROSCOPIC-ADD ON - Abnormal; Notable for the following:    Squamous Epithelial / LPF 0-5 (*)    Bacteria, UA FEW (*)     All other components within normal limits  PROTIME-INR  APTT  CBC  DIFFERENTIAL  TROPONIN I  LIPASE, BLOOD    Imaging Review Ct Head Wo Contrast  03/09/2016  CLINICAL DATA:  Nausea and vomiting. Headache and dizziness. Confusion. Symptoms began this morning. EXAM: CT HEAD WITHOUT CONTRAST TECHNIQUE: Contiguous axial images were obtained from the base of the skull through the vertex without intravenous contrast. COMPARISON:  11/17/2014 FINDINGS: The brain does not show accelerated atrophy. There is no evidence of old or acute focal small or large vessel infarction. No mass lesion, hemorrhage, hydrocephalus or extra-axial collection. The calvarium is unremarkable. Sinuses, middle ears and mastoids are clear. IMPRESSION: Normal exam for age. No acute finding. No cause of the presenting symptoms is identified. Electronically Signed   By: Paulina Fusi M.D.   On: 03/09/2016 11:22   I have personally reviewed and evaluated these images and lab results as part of my medical decision-making.   EKG Interpretation   Date/Time:  Thursday March 09 2016 10:12:31 EDT  Ventricular Rate:  74 PR Interval:    QRS Duration: 89 QT Interval:  394 QTC Calculation: 438 R Axis:   77 Text Interpretation:  Sinus rhythm nonspecific T wave abnormality in I and  aVL Confirmed by [facility] MD, Barbara Cower [phone]) on 03/09/2016 10:15:53 AM      MDM   Final diagnoses:  Non-intractable vomiting with nausea, vomiting of unspecified type  Viral gastroenteritis  Dizziness   Pt presents with nausea and vomiting and a sensation of dizziness that made it difficult for her to stand this morning. Dizziness resolved with IVFs and zofran. CT head showed no acute abnormalities. EKG without ST changes. Troponin negative. INR WNL. CBC without leukocytosis. CMP without electrolyte abnormalities. Able to walk without assistance prior to discharge. Given that patient's symptoms resolved with zofran and IVFs, suspect she may have a viral  gastroenteritis. Gave patient and daughter strict return precautions, including chest pain, syncope, slurring of speech and focal weakness.  Dani Gobble, MD [facility], PGY-2     [facility], MD 03/09/16 1191  Marily Memos, MD 03/10/16 [phone]

## 2016-03-09 NOTE — ED Notes (Signed)
MD at bedside. 

## 2016-03-09 NOTE — ED Notes (Signed)
Pt ambulatory to restroom steady gait.

## 2016-03-09 NOTE — ED Notes (Addendum)
MD at bedside. 

## 2016-03-09 NOTE — Discharge Instructions (Signed)
Thank you for bringing [name] in.  She may have a stomach virus. Her symptoms improved with fluids and anti-nausea medicine. Continue to encourage good fluid intake (8 glasses of water a day). Brain imaging was normal. Electrical activity of the heart and electrolytes were normal, as well.   If she has slurring of speech, weakness, chest pain, passes out, please bring her back to the emergency room for evaluation.   Dizziness Dizziness is a common problem. It is a feeling of unsteadiness or light-headedness. You may feel like you are about to faint. Dizziness can lead to injury if you stumble or fall. Anyone can become dizzy, but dizziness is more common in older adults. This condition can be caused by a number of things, including medicines, dehydration, or illness. HOME CARE INSTRUCTIONS Taking these steps may help with your condition: Eating and Drinking  Drink enough fluid to keep your urine clear or pale yellow. This helps to keep you from becoming dehydrated. Try to drink more clear fluids, such as water.  Do not drink alcohol.  Limit your caffeine intake if directed by your health care provider.  Limit your salt intake if directed by your health care provider. Activity  Avoid making quick movements.  Rise slowly from chairs and steady yourself until you feel okay.  In the morning, first sit up on the side of the bed. When you feel okay, stand slowly while you hold onto something until you know that your balance is fine.  Move your legs often if you need to stand in one place for a long time. Tighten and relax your muscles in your legs while you are standing.  Do not drive or operate heavy machinery if you feel dizzy.  Avoid bending down if you feel dizzy. Place items in your home so that they are easy for you to reach without leaning over. Lifestyle  Do not use any tobacco products, including cigarettes, chewing tobacco, or electronic cigarettes. If you need help quitting,  ask your health care provider.  Try to reduce your stress level, such as with yoga or meditation. Talk with your health care provider if you need help. General Instructions  Watch your dizziness for any changes.  Take medicines only as directed by your health care provider. Talk with your health care provider if you think that your dizziness is caused by a medicine that you are taking.  Tell a friend or a family member that you are feeling dizzy. If he or she notices any changes in your behavior, have this person call your health care provider.  Keep all follow-up visits as directed by your health care provider. This is important. SEEK MEDICAL CARE IF:  Your dizziness does not go away.  Your dizziness or light-headedness gets worse.  You feel nauseous.  You have reduced hearing.  You have new symptoms.  You are unsteady on your feet or you feel like the room is spinning. SEEK IMMEDIATE MEDICAL CARE IF:  You vomit or have diarrhea and are unable to eat or drink anything.  You have problems talking, walking, swallowing, or using your arms, hands, or legs.  You feel generally weak.  You are not thinking clearly or you have trouble forming sentences. It may take a friend or family member to notice this.  You have chest pain, abdominal pain, shortness of breath, or sweating.  Your vision changes.  You notice any bleeding.  You have a headache.  You have neck pain or a stiff  neck.  You have a fever.   This information is not intended to replace advice given to you by your health care provider. Make sure you discuss any questions you have with your health care provider.   Document Released: 01/31/2001 Document Revised: 12/22/2014 Document Reviewed: 08/03/2014 Elsevier Interactive Patient Education [employer].

## 2016-03-09 NOTE — ED Notes (Signed)
Patient transported to CT via stretcher per tech. 

## 2016-03-09 NOTE — ED Notes (Signed)
Pt reports dizziness that started this morning with secondary vomiting.  Family reports she had a "shaking" episode.  Reports "strange feeling" in her head.  Reports feels "shaky" in her right arm.

## 2016-03-09 NOTE — ED Provider Notes (Signed)
I saw and evaluated the patient, reviewed the resident's note and I agree with the findings and plan.  75 year old female who does not speak English however used her daughter as an interpreter was doing fine then acute onset of lightheadedness versus vertigo causing her to not be able to walk without the symptoms. She's had on the floor and had a couple episodes of vomiting secondary to this. No diarrhea, constipation, fever or other complaints. No chest pain or other symptoms.  On exam her cranial nerves are intact she has good strength in bilateral upper and lower extremities. She has good sensation in bilateral upper and lower extremity is. She has good finger to nose without any evidence of dysmetria. Her abdomen is benign with good bowel sounds. Cardiac is regular rate and rhythm lungs are clear to auscultation bilaterally. Difficult to discern if this is vertigo versus lightheadedness so we will treat and workup for both. The patient is able to have improved nausea and a blade without any difficulty with a negative workup patient likely be discharged. However if not may need transfer or admission for MRI.   EKG Interpretation   Date/Time:  Thursday March 09 2016 10:12:31 EDT Ventricular Rate:  74 PR Interval:    QRS Duration: 89 QT Interval:  394 QTC Calculation: 438 R Axis:   77 Text Interpretation:  Sinus rhythm nonspecific T wave abnormality in I and  aVL Confirmed by [facility] MD, Barbara CowerJASON [phone]) on 03/09/2016 10:15:53 AM        Marily MemosJason Malaina Mortellaro, MD 03/09/16 1536

## 2017-02-21 IMAGING — CT CT HEAD W/O CM
3 series · 16 of 47 positions shown, 19 images · non-contrast
Comparison: 11/17/2014

CLINICAL DATA: Nausea and vomiting. Headache and dizziness.
Confusion. Symptoms began this morning.

EXAM:
CT HEAD WITHOUT CONTRAST
TECHNIQUE: Contiguous axial images were obtained from the base of the skull
through the vertex without intravenous contrast.

[Series 2: head wo · axial · 0.39mm/px · z∈[-165,-40]mm · 10 of 30 slices shown, 13 images]
[im 3/30  brain]
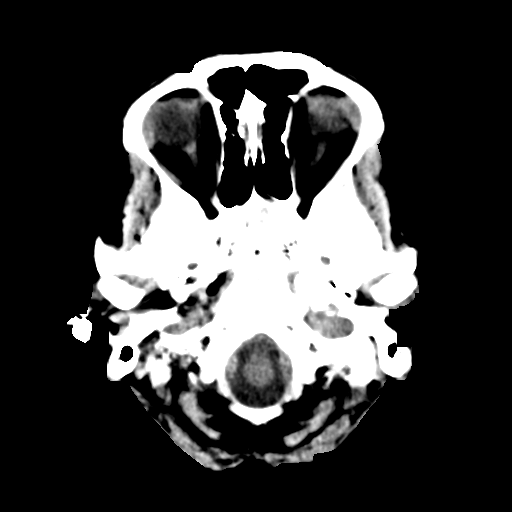
[im 3/30  bone]
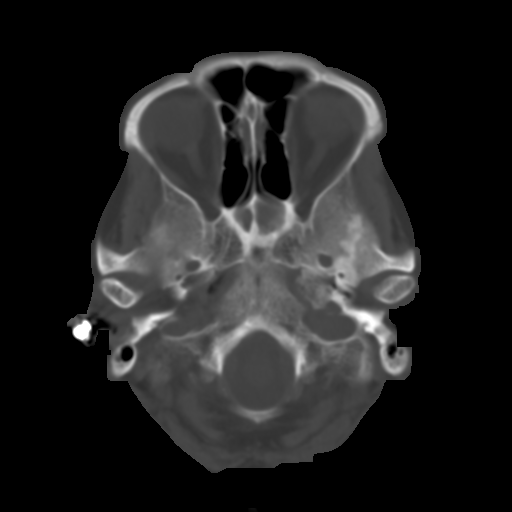
[im 6/30  brain]
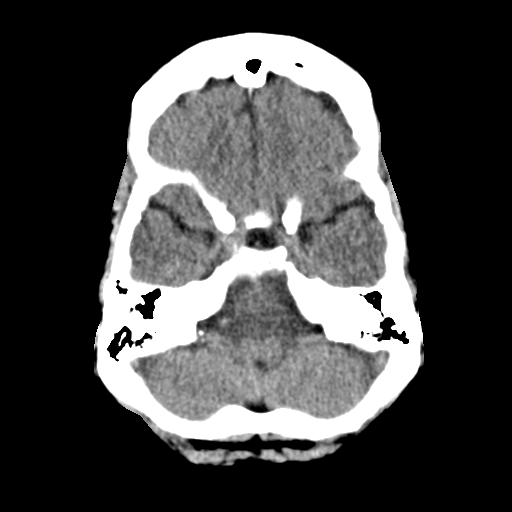
[im 9/30  brain]
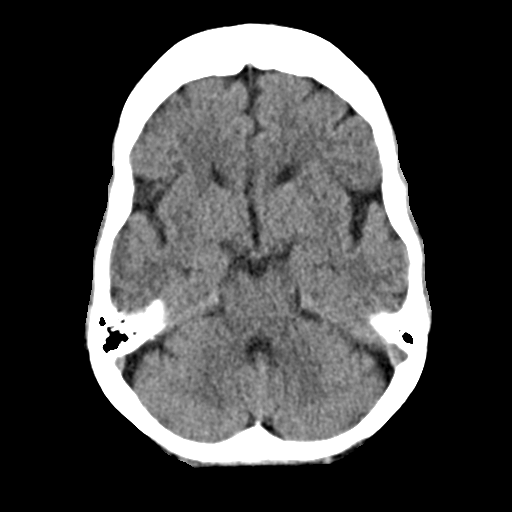
[im 11/30  brain]
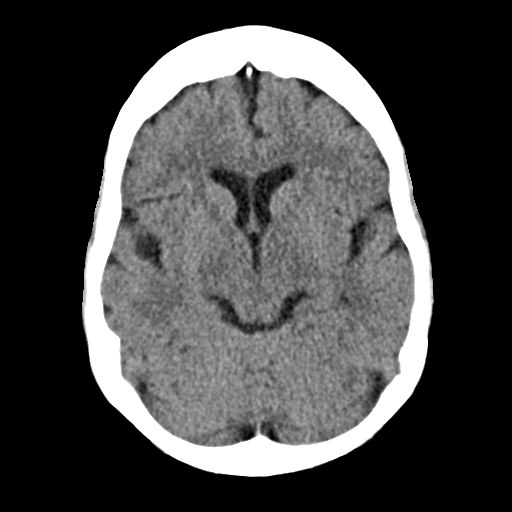
[im 14/30  brain]
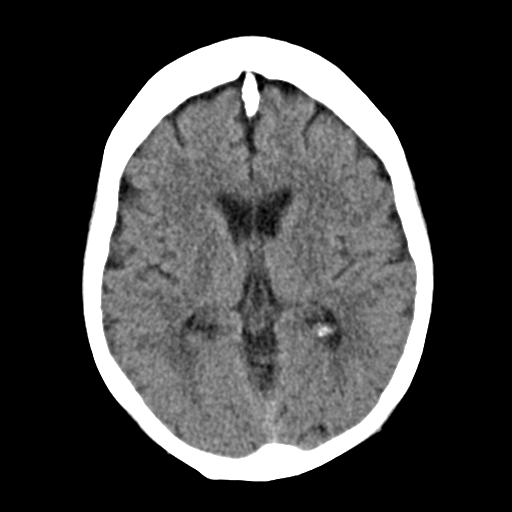
[im 14/30  bone]
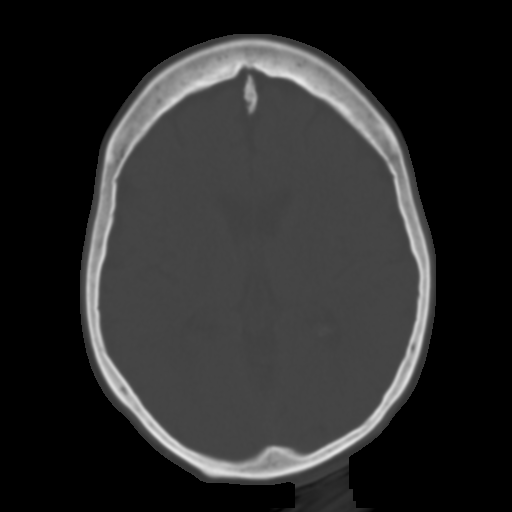
[im 17/30  brain]
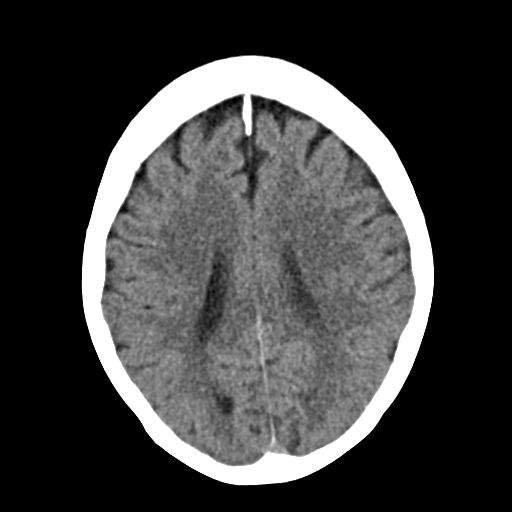
[im 20/30  brain]
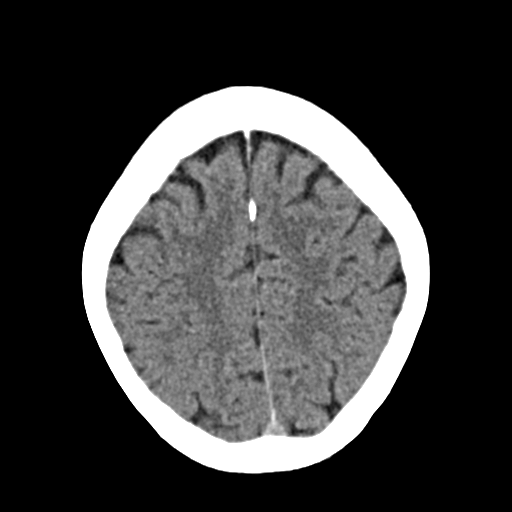
[im 23/30  brain]
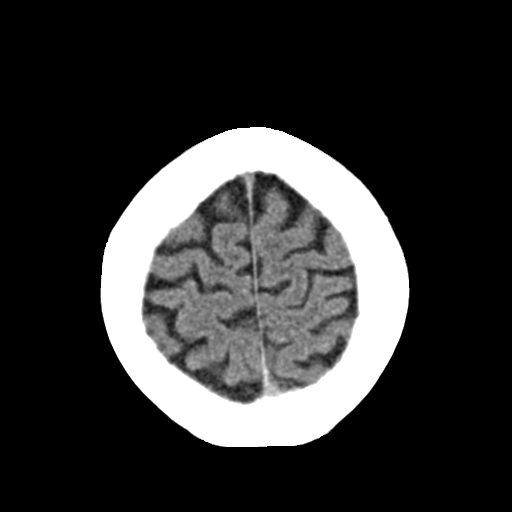
[im 25/30  brain]
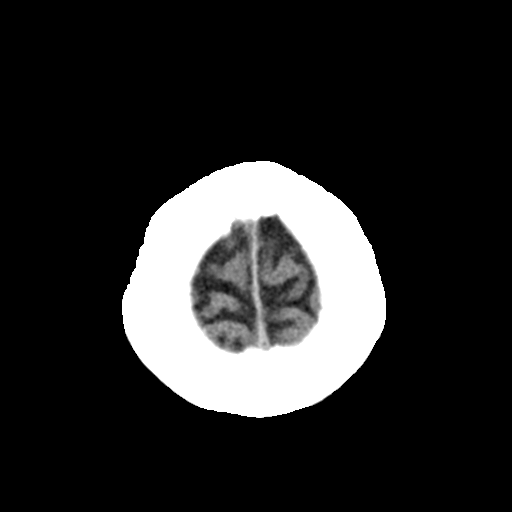
[im 25/30  bone]
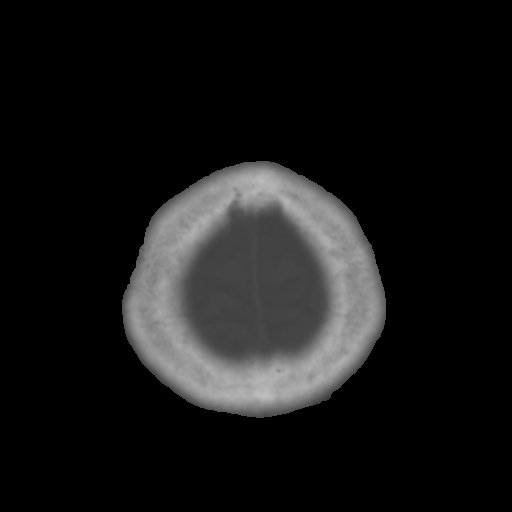
[im 28/30  brain]
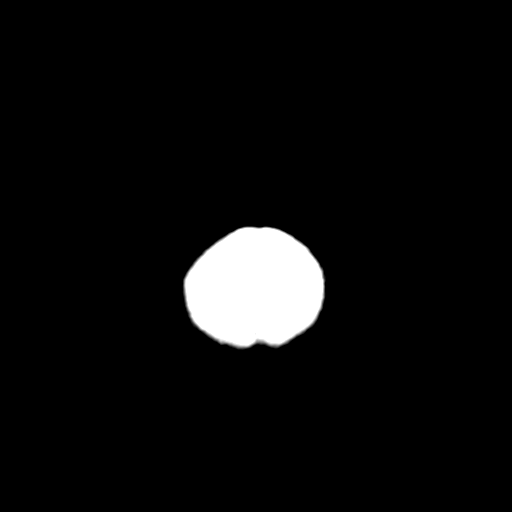

[Series 4: coronal soft · coronal · 0.29mm/px · 3 of 65 slices shown]
[im 22/65  brain]
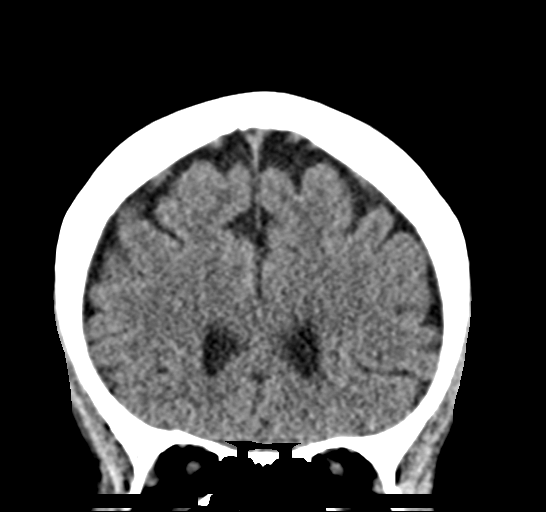
[im 29/65  brain]
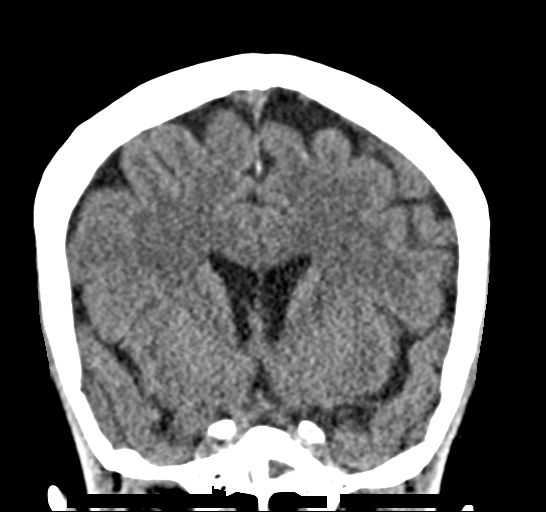
[im 36/65  brain]
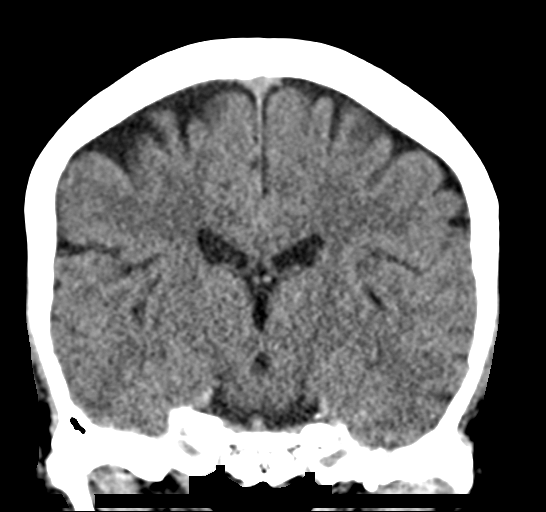

[Series 5: sag soft · sagittal · 0.30mm/px · 3 of 52 slices shown]
[im 18/52  brain]
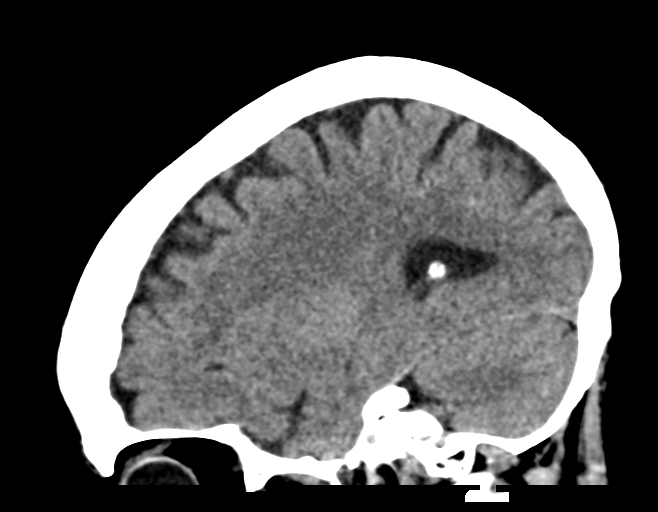
[im 26/52  brain]
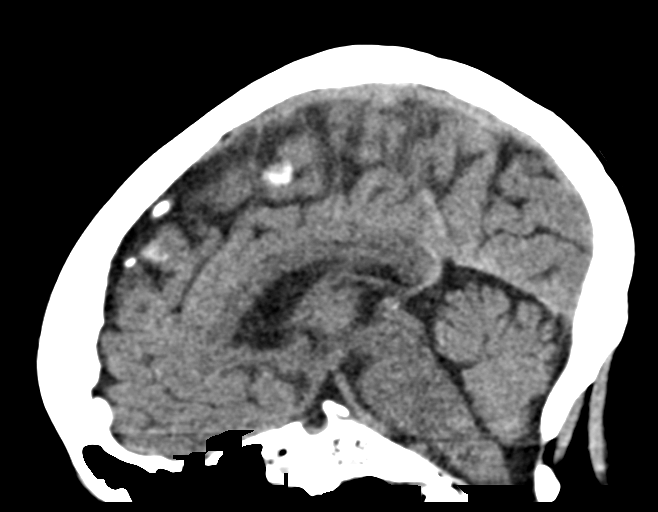
[im 35/52  brain]
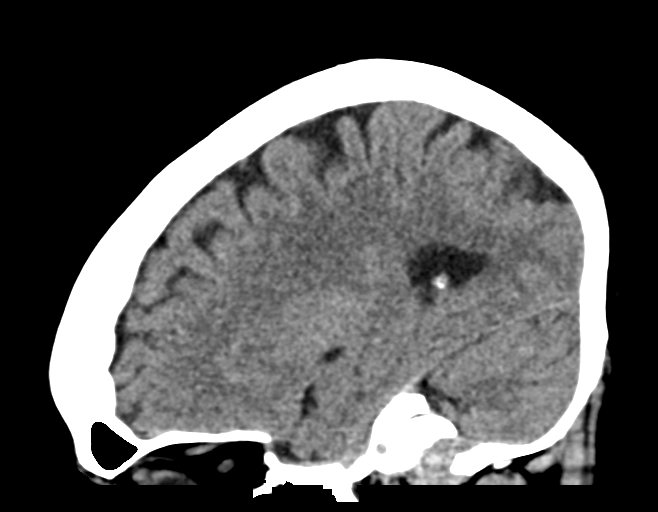

[16 of 47 positions shown; findings below may reference images not displayed]

FINDINGS: The brain does not show accelerated atrophy. There is no evidence of
old or acute focal small or large vessel infarction. No mass lesion,
hemorrhage, hydrocephalus or extra-axial collection. The calvarium
is unremarkable. Sinuses, middle ears and mastoids are clear.
IMPRESSION: Normal exam for age. No acute finding. No cause of the presenting
symptoms is identified.

## 2023-09-23 ENCOUNTER — Other Ambulatory Visit: Payer: Self-pay

## 2023-09-23 ENCOUNTER — Encounter (HOSPITAL_BASED_OUTPATIENT_CLINIC_OR_DEPARTMENT_OTHER): Payer: Self-pay

## 2023-09-23 ENCOUNTER — Emergency Department (HOSPITAL_BASED_OUTPATIENT_CLINIC_OR_DEPARTMENT_OTHER)
Admission: EM | Admit: 2023-09-23 | Discharge: 2023-09-24 | Disposition: A | Discharge status: Home / Self Care | Payer: Medicare Other | Attending: Emergency Medicine | Admitting: Emergency Medicine

## 2023-09-23 DIAGNOSIS — R1310 Dysphagia, unspecified: Secondary | ICD-10-CM | POA: Diagnosis present

## 2023-09-23 DIAGNOSIS — Z7982 Long term (current) use of aspirin: Secondary | ICD-10-CM | POA: Insufficient documentation

## 2023-09-23 DIAGNOSIS — Z20822 Contact with and (suspected) exposure to covid-19: Secondary | ICD-10-CM | POA: Insufficient documentation

## 2023-09-23 DIAGNOSIS — R0602 Shortness of breath: Secondary | ICD-10-CM | POA: Diagnosis not present

## 2023-09-23 DIAGNOSIS — R030 Elevated blood-pressure reading, without diagnosis of hypertension: Secondary | ICD-10-CM | POA: Insufficient documentation

## 2023-09-23 DIAGNOSIS — E039 Hypothyroidism, unspecified: Secondary | ICD-10-CM | POA: Diagnosis not present

## 2023-09-23 NOTE — ED Notes (Signed)
Patient presents with complaints of SOB. BBS = w/no wheezes, rales or rhonchi noted. Vitals are 79 HR, Oxygen saturation 100%. RR 16. Laryngeal normal structures visibly, malampadi 1. Patient presents in no distress.

## 2023-09-23 NOTE — ED Triage Notes (Signed)
Patient arrives POV c/o SHOB that started 1 hour ago and states she has trouble swallowing her saliva. Patient's family states she used her albuterol inhaler prior to ED arrival; states it provided mild relief. Boneta Lucks, RRT assessing patient in triage. Lungs clear in triage.

## 2023-09-24 DIAGNOSIS — R1310 Dysphagia, unspecified: Secondary | ICD-10-CM | POA: Diagnosis not present

## 2023-09-24 LAB — RESP PANEL BY RT-PCR (RSV, FLU A&B, COVID)  RVPGX2
Influenza A by PCR: NEGATIVE
Influenza B by PCR: NEGATIVE
Resp Syncytial Virus by PCR: NEGATIVE
SARS Coronavirus 2 by RT PCR: NEGATIVE

## 2023-09-24 NOTE — Discharge Instructions (Signed)
Return to the emergency department if your symptoms significantly worsen or change.  You can follow-up the results of the respiratory panel in MyChart.

## 2023-09-24 NOTE — ED Provider Notes (Signed)
[facility] Provider Note   CSN: [id number] Arrival date & time: 09/23/23  2335     History  Chief Complaint  Patient presents with   Shortness of Breath   Hypertension    Katelyn Bennett is a 83 y.o. female.  Patient is an 83 year old female with history of hypothyroidism.  Patient presenting today for evaluation of difficulty swallowing and elevated blood pressure.  This episode started abruptly at approximately 9 PM shortly after having dinner.  She felt drainage to the back of her throat which made it difficult for her to breathe and swallow.  Her blood pressure at home was 190 systolic and family brings her for evaluation.  At the time of my evaluation, her symptoms have since resolved and she has no complaints.  No fevers or chills.  No cough.  The history is provided by the patient.       Home Medications Prior to Admission medications   Medication Sig Start Date End Date Taking? Authorizing Provider  alum & mag hydroxide-simeth (MAALOX/MYLANTA) 200-200-20 MG/5ML suspension Take 30 mLs by mouth every 6 (six) hours as needed for indigestion or heartburn (dyspepsia). 07/19/14   Dhungel, Theda Belfast, MD  aspirin EC 81 MG tablet Take 81 mg by mouth daily.    [provider]  bismuth-metronidazole-tetracycline (PYLERA) [phone] MG per capsule Take 3 capsules by mouth 4 (four) times daily.    [provider]  levothyroxine (SYNTHROID, LEVOTHROID) 50 MCG tablet Take 50 mcg by mouth daily before breakfast.    [provider]  omeprazole (PRILOSEC) 20 MG capsule Take 20 mg by mouth 2 (two) times daily before a meal.    [provider]  ondansetron (ZOFRAN) 4 MG tablet Take 1 tablet (4 mg total) by mouth every 8 (eight) hours as needed for nausea or vomiting. 03/09/16   Casey Burkitt, MD      Allergies    Patient has no known allergies.    Review of Systems   Review of Systems  All other  systems reviewed and are negative.   Physical Exam Updated Vital Signs BP (!) 182/91 (BP Location: Left Arm)   Pulse 80   Temp (!) 97.3 F (36.3 C) (Oral)   Resp 20   Ht 5\' 1"  (1.549 m)   Wt 54.4 kg   SpO2 99%   BMI 22.67 kg/m  Physical Exam Vitals and nursing note reviewed.  Constitutional:      General: She is not in acute distress.    Appearance: She is well-developed. She is not diaphoretic.  HENT:     Head: Normocephalic and atraumatic.     Mouth/Throat:     Mouth: Mucous membranes are moist.     Pharynx: Oropharynx is clear. No pharyngeal swelling or oropharyngeal exudate.  Cardiovascular:     Rate and Rhythm: Normal rate and regular rhythm.     Heart sounds: No murmur heard.    No friction rub. No gallop.  Pulmonary:     Effort: Pulmonary effort is normal. No respiratory distress.     Breath sounds: Normal breath sounds. No wheezing.  Abdominal:     General: Bowel sounds are normal. There is no distension.     Palpations: Abdomen is soft.     Tenderness: There is no abdominal tenderness.  Musculoskeletal:        General: Normal range of motion.     Cervical back: Normal range of motion and neck supple.  Skin:  General: Skin is warm and dry.  Neurological:     General: No focal deficit present.     Mental Status: She is alert and oriented to person, place, and time.     ED Results / Procedures / Treatments   Labs (all labs ordered are listed, but only abnormal results are displayed) Labs Reviewed - No data to display  EKG None  Radiology No results found.  Procedures Procedures    Medications Ordered in ED Medications - No data to display  ED Course/ Medical Decision Making/ A&P  Patient is an 83 year old female presenting with difficulty swallowing as described in the HPI.  This sensation has since passed and she is now back to normal.  Patient arrives here with stable vital signs and is afebrile.  Physical examination is unremarkable.   Throat is clear.  Patient now able to swallow water without difficulty.  The cause of this episode is unknown, but she feels better and wants to go.  I am comfortable with this and will discharge patient.  Daughter has requested a virus test so we will obtain a respiratory panel that can be followed up on MyChart.  Final Clinical Impression(s) / ED Diagnoses Final diagnoses:  None    Rx / DC Orders ED Discharge Orders     None         Geoffery Lyons, MD 09/24/23 [phone]
